# Patient Record
Sex: Male | Born: 1960 | Race: White | Hispanic: No | Marital: Married | Smoking: Never smoker
Health system: Southern US, Community
[De-identification: ages and names within clinical notes are randomized; demographics above are authoritative.]

## PROBLEM LIST (undated history)

## (undated) DIAGNOSIS — Z0000 Encounter for general adult medical examination without abnormal findings: Secondary | ICD-10-CM

## (undated) DIAGNOSIS — M545 Low back pain: Secondary | ICD-10-CM

## (undated) DIAGNOSIS — F418 Other specified anxiety disorders: Secondary | ICD-10-CM

## (undated) DIAGNOSIS — D126 Benign neoplasm of colon, unspecified: Secondary | ICD-10-CM

## (undated) DIAGNOSIS — G479 Sleep disorder, unspecified: Secondary | ICD-10-CM

## (undated) DIAGNOSIS — L719 Rosacea, unspecified: Secondary | ICD-10-CM

## (undated) DIAGNOSIS — R351 Nocturia: Secondary | ICD-10-CM

## (undated) DIAGNOSIS — G8929 Other chronic pain: Secondary | ICD-10-CM

## (undated) DIAGNOSIS — R51 Headache: Secondary | ICD-10-CM

## (undated) DIAGNOSIS — R011 Cardiac murmur, unspecified: Secondary | ICD-10-CM

## (undated) DIAGNOSIS — C801 Malignant (primary) neoplasm, unspecified: Secondary | ICD-10-CM

## (undated) DIAGNOSIS — C4490 Unspecified malignant neoplasm of skin, unspecified: Secondary | ICD-10-CM

## (undated) DIAGNOSIS — E785 Hyperlipidemia, unspecified: Secondary | ICD-10-CM

## (undated) DIAGNOSIS — M79672 Pain in left foot: Secondary | ICD-10-CM

## (undated) HISTORY — DX: Rosacea, unspecified: L71.9

## (undated) HISTORY — DX: Other chronic pain: G89.29

## (undated) HISTORY — DX: Headache: R51

## (undated) HISTORY — DX: Hyperlipidemia, unspecified: E78.5

## (undated) HISTORY — DX: Other specified anxiety disorders: F41.8

## (undated) HISTORY — DX: Pain in left foot: M79.672

## (undated) HISTORY — PX: CYSTOSCOPY WITH INSERTION OF UROLIFT: SHX6678

## (undated) HISTORY — DX: Low back pain: M54.5

## (undated) HISTORY — DX: Nocturia: R35.1

## (undated) HISTORY — DX: Sleep disorder, unspecified: G47.9

## (undated) HISTORY — DX: Encounter for general adult medical examination without abnormal findings: Z00.00

## (undated) HISTORY — DX: Unspecified malignant neoplasm of skin, unspecified: C44.90

## (undated) HISTORY — PX: COLONOSCOPY: SHX174

---
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
## 2009-04-22 ENCOUNTER — Ambulatory Visit: Payer: Self-pay | Admitting: Internal Medicine

## 2009-04-22 DIAGNOSIS — L719 Rosacea, unspecified: Secondary | ICD-10-CM | POA: Insufficient documentation

## 2009-04-22 DIAGNOSIS — R0609 Other forms of dyspnea: Secondary | ICD-10-CM | POA: Insufficient documentation

## 2009-04-22 DIAGNOSIS — R0989 Other specified symptoms and signs involving the circulatory and respiratory systems: Secondary | ICD-10-CM

## 2009-04-22 DIAGNOSIS — E785 Hyperlipidemia, unspecified: Secondary | ICD-10-CM | POA: Insufficient documentation

## 2009-04-22 LAB — CONVERTED CEMR LAB
ALT: 21 units/L (ref 0–53)
AST: 20 units/L (ref 0–37)
Albumin: 4.8 g/dL (ref 3.5–5.2)
Alkaline Phosphatase: 65 units/L (ref 39–117)
Basophils Absolute: 0 10*3/uL (ref 0.0–0.1)
CRP: 0.4 mg/dL (ref <0.6)
Calcium: 9.1 mg/dL (ref 8.4–10.5)
Chloride: 104 meq/L (ref 96–112)
Cholesterol: 249 mg/dL — ABNORMAL HIGH (ref 0–200)
Creatinine, Ser: 1.01 mg/dL (ref 0.40–1.50)
Eosinophils Absolute: 0.1 10*3/uL (ref 0.0–0.7)
HDL: 35 mg/dL — ABNORMAL LOW (ref >39)
LDL Cholesterol: 178 mg/dL — ABNORMAL HIGH (ref 0–99)
Lymphs Abs: 2 10*3/uL (ref 0.7–4.0)
MCV: 84.5 fL (ref 78.0–100.0)
Neutrophils Relative %: 61 % (ref 43–77)
Platelets: 225 10*3/uL (ref 150–400)
RDW: 13.5 % (ref 11.5–15.5)
TSH: 1.631 microintl units/mL (ref 0.350–4.500)
Total Protein: 7 g/dL (ref 6.0–8.3)
Triglycerides: 179 mg/dL — ABNORMAL HIGH (ref <150)
WBC: 7.1 10*3/uL (ref 4.0–10.5)

## 2009-04-26 ENCOUNTER — Telehealth: Payer: Self-pay | Admitting: Internal Medicine

## 2009-05-18 ENCOUNTER — Encounter: Payer: Self-pay | Admitting: Cardiology

## 2009-05-18 ENCOUNTER — Ambulatory Visit: Payer: Self-pay | Admitting: Cardiology

## 2009-08-18 ENCOUNTER — Ambulatory Visit: Payer: Self-pay | Admitting: Cardiovascular Disease

## 2009-08-18 ENCOUNTER — Ambulatory Visit: Payer: Self-pay

## 2009-08-18 ENCOUNTER — Encounter: Payer: Self-pay | Admitting: Cardiology

## 2009-08-18 ENCOUNTER — Ambulatory Visit (HOSPITAL_COMMUNITY): Admission: RE | Admit: 2009-08-18 | Discharge: 2009-08-18 | Payer: Self-pay | Admitting: Cardiology

## 2010-06-29 ENCOUNTER — Ambulatory Visit: Payer: Self-pay | Admitting: Internal Medicine

## 2010-06-29 LAB — CONVERTED CEMR LAB
CRP, High Sensitivity: 1.7
Cholesterol: 231 mg/dL — ABNORMAL HIGH (ref 0–200)
HDL: 36 mg/dL — ABNORMAL LOW (ref >39)
Triglycerides: 102 mg/dL (ref <150)

## 2010-06-30 ENCOUNTER — Telehealth: Payer: Self-pay | Admitting: Internal Medicine

## 2010-07-07 ENCOUNTER — Telehealth: Payer: Self-pay | Admitting: Internal Medicine

## 2010-07-11 ENCOUNTER — Ambulatory Visit: Payer: Self-pay | Admitting: Internal Medicine

## 2010-07-11 DIAGNOSIS — K625 Hemorrhage of anus and rectum: Secondary | ICD-10-CM | POA: Insufficient documentation

## 2010-07-12 ENCOUNTER — Encounter: Payer: Self-pay | Admitting: Internal Medicine

## 2010-11-21 NOTE — Assessment & Plan Note (Signed)
Summary: rectal bleeding/mhf--Rm 3   Vital Signs:  Patient profile:   50 year old male Height:      70 inches Weight:      201.50 pounds BMI:     29.02 O2 Sat:      97 % on Room air Temp:     98.0 degrees F oral Pulse rate:   84 / minute Pulse rhythm:   regular Resp:     16 per minute BP sitting:   116 / 82  (right arm) Cuff size:   regular  Vitals Entered By: Mervin Kung CMA Duncan Dull) (July 11, 2010 2:16 PM)  O2 Flow:  Room air CC: Rm 3  Pt states he has hemorrhoid since Saturday and bleeding since Monday. Is Patient Diabetic? No Comments Pt not taking Simvastatin yet, trying diet. Nicki Guadalajara Fergerson CMA Duncan Dull)  July 11, 2010 2:20 PM    Primary Care Provider:  D. Thomos Lemons DO  CC:  Rm 3  Pt states he has hemorrhoid since Saturday and bleeding since Monday.Marland Kitchen  History of Present Illness: 50 y/o white male c/o intermittent rectal bleeding Hx of bleeding hemorrhoids bleeding through his clothes no severe bleeding  some pain.  ? pain radiating to left leg   Preventive Screening-Counseling & Management  Alcohol-Tobacco     Alcohol drinks/day: 0     Alcohol type: all     Alcohol Counseling: not indicated; patient does not drink     Smoking Status: never     Tobacco Counseling: not indicated; no tobacco use  Allergies (verified): No Known Drug Allergies  Past History:  Past Medical History: Current Problems:  HYPERLIPIDEMIA (ICD-272.4) ROSACEA (ICD-695.3)  DYSPNEA ON EXERTION (ICD-786.09)  HEALTH MAINTENANCE EXAM (ICD-V70.0) FAMILY HISTORY OF CAD MALE 1ST DEGREE RELATIVE <50 (ICD-V17.3)  Past Surgical History: Tonsillectomy 1971     Family History: Family History High cholesterol Family History Hypertension Family History Kidney disease Colon ca - no Prostate ca - no Father with congestive heart failure    Social History: Occupation:  Nurse, 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   Lungs:  normal respiratory effort and normal breath sounds.   Heart:  normal rate, regular rhythm, no murmur, and no gallop.   Abdomen:  soft, non-tender, and normal bowel sounds.   Rectal:  thrombosed external hemorrhoid(s).  heme positive   Impression & Recommendations:  Problem # 1:  RECTAL BLEEDING (ICD-569.3)  pt has thrombosed ext hemorrhoids.  he will try conservative measures - sitz bath.  if symptoms get worse, we discussed referral to proctologist.  I doubt any other source of GI bleed but due to his age,  I rec we set up screening colonoscopy  Orders: Gastroenterology Referral (GI)  Complete Medication List: 1)  Fish Oil 1000 Mg Caps (Omega-3 fatty acids) .... Take 1 capsule by mouth three times a daysometimes 4 2)  Multivitamins Tabs (Multiple vitamin) .... Take 1 tablet by mouth once a day 3)  Lovastatin 20 Mg Tabs (Lovastatin) .... One by mouth once daily  Patient Instructions: 1)  Call our office if your symptoms do not  improve or gets worse. 2)  Stop aspirin and fish oil until rectal bleeding stops   Current Allergies (reviewed today): No known allergies

## 2010-11-21 NOTE — Assessment & Plan Note (Signed)
Summary: CPX/DT   Vital Signs:  Patient profile:   50 year old male Height:      70 inches Weight:      197.75 pounds BMI:     28.48 O2 Sat:      99 % on Room air Temp:     98.0 degrees F oral Pulse rate:   68 / minute Pulse rhythm:   regular Resp:     16 per minute BP sitting:   112 / 70  (right arm) Cuff size:   large  Vitals Entered By: Glendell Docker CMA (June 29, 2010 8:40 AM)  O2 Flow:  Room air CC: CPX Is Patient Diabetic? No Pain Assessment Patient in pain? no      Comments coffee this am no solid food   Primary Care Provider:  Dondra Spry DO  CC:  CPX.  History of Present Illness: 50 y/o white male for routine cpx last yr - underwent stress echo -normal he changed diet - less carbs.  lost approx 20 lbs feeling better  no dyspnea with exertion still active in [employer]  plays tennis  hyperlipidemia - never started simvastatin wants to see what cholesterol is since wt loss  Preventive Screening-Counseling & Management  Alcohol-Tobacco     Alcohol drinks/day: 0     Smoking Status: never  Caffeine-Diet-Exercise     Caffeine use/day: 2 beverages daily     Does Patient Exercise: yes     Times/week: 3  Allergies (verified): No Known Drug Allergies  Past History:  Past Medical History: Current Problems:  HYPERLIPIDEMIA (ICD-272.4) ROSACEA (ICD-695.3)  DYSPNEA ON EXERTION (ICD-786.09) HEALTH MAINTENANCE EXAM (ICD-V70.0) FAMILY HISTORY OF CAD MALE 1ST DEGREE RELATIVE <50 (ICD-V17.3)  Past Surgical History: Tonsillectomy 1971    Family History: Family History High cholesterol Family History Hypertension Family History Kidney disease Colon ca - no Prostate ca - no Father with congestive heart failure   Social History: Occupation:  Nurse, 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         no erectile dysfunction  Physical Exam  General:  alert, well-developed, and well-nourished.   Head:  normocephalic and atraumatic.   Eyes:  pupils equal, pupils round, and pupils reactive to light.   Ears:  R ear normal and L ear normal.   Mouth:  Oral mucosa and oropharynx without lesions or exudates.  Teeth in good repair. Neck:  No deformities, masses, or tenderness noted. Lungs:  normal respiratory effort and normal breath sounds.   Heart:  normal rate, regular rhythm, no murmur, and no gallop.   Abdomen:  soft, non-tender, normal bowel sounds, no hepatomegaly, and no splenomegaly.   Extremities:  No clubbing, cyanosis, edema  Neurologic:  cranial nerves II-XII intact and gait normal.   Skin:  2mm moles near left scapula and similar sized lesion mid lower back Psych:  normally interactive, good eye contact, and not anxious appearing.     Impression & Recommendations:  Problem # 1:  HEALTH MAINTENANCE EXAM (ICD-V70.0) Reviewed adult health maintenance protocols. Pt counseled on diet and exercise.  (increase intake of vegetable, reduce animal protein) next yr - colonoscopy wife to monitor small moles on back  Td Booster: Tdap (06/29/2010)   Chol: 249 (04/22/2009)   HDL: 35 (04/22/2009)   LDL: 178 (04/22/2009)  TG: 179 (04/22/2009) TSH: 1.631 (04/22/2009)     Td Booster: Tdap (06/29/2010)   Chol: 249 (04/22/2009)   HDL: 35 (04/22/2009)   LDL: 178 (04/22/2009)   TG: 179 (04/22/2009) TSH: 1.631 (04/22/2009)     Complete Medication List: 1)  Finacea 15 % Gel (Azelaic acid) .... Once a day 2)  Fish Oil 1000 Mg Caps (Omega-3 fatty acids) .... Take 1 capsule by mouth three times a daysometimes 4 3)  Multivitamins Tabs (Multiple vitamin) .... Take 1 tablet by mouth once a day  Other Orders: Tdap => 78yrs IM [phone]) Admin 1st Vaccine (60454) T-Lipid Profile [phone]) T- * Misc. Laboratory test 50 865)  Patient  Instructions: 1)  Please schedule a follow-up appointment in 1 year.  Current Allergies (reviewed today): No known allergies      Immunizations Administered:  Tetanus Vaccine:    Vaccine Type: Tdap    Site: left deltoid    Mfr: GlaxoSmithKline    Dose: 0.5 ml    Route: IM    Given by: Glendell Docker CMA    Exp. Date: 07/12/2012    Lot #: [id number]    VIS given: 09/08/08 version given June 29, 2010.

## 2010-11-21 NOTE — Progress Notes (Signed)
Summary: Lab results  Phone Note Outgoing Call   Summary of Call: call pt - bad cholesterol still too high 175.  I suggest pt start cholesterol lowering medication in addition to low saturated fat diet see rx for lovastatin.   plz schedule FLP, LFTs in 3 months. Initial call taken by: D. Thomos Lemons DO,  June 30, 2010 8:51 AM  Follow-up for Phone Call        Left message with pt's son to have pt return my call. Nicki Guadalajara Fergerson CMA Duncan Dull)  June 30, 2010 11:56 AM   Additional Follow-up for Phone Call Additional follow up Details #1::        call placed to patient at [phone], no answer. A detailed voice message was left informing patient per Dr Artist Pais instructions Additional Follow-up by: Glendell Docker CMA,  July 03, 2010 8:25 AM    New/Updated Medications: LOVASTATIN 20 MG TABS (LOVASTATIN) one by mouth once daily Prescriptions: LOVASTATIN 20 MG TABS (LOVASTATIN) one by mouth once daily  #30 x 3   Entered and Authorized by:   D. Thomos Lemons DO   Signed by:   D. Thomos Lemons DO on 06/30/2010   Method used:   Electronically to        [id number] (retail)       [address]       [place], [place]  [address]       Ph: [phone] or [phone]       Fax: [phone]   RxID:   [id number]

## 2010-11-21 NOTE — Miscellaneous (Signed)
Summary: lipid pnl order  Clinical Lists Changes  Orders: Added new Test order of T-Lipid Profile [phone]) - Signed

## 2010-11-21 NOTE — Progress Notes (Signed)
Summary: Lab result concerns--LM 9/16  Phone Note Call from Patient Call back at [phone]   Caller: Patient Reason for Call: Talk to Nurse Summary of Call: pt has questions about recent lab results Initial call taken by: Lannette Donath,  July 07, 2010 10:08 AM  Follow-up for Phone Call        Results given to pt again. Pt states he wants to continue losing weight to see if this will bring his cholesterol down. States that this has worked in the past. Does not want to start cholesterol med. Please advise. Nicki Guadalajara Fergerson CMA Duncan Dull)  July 07, 2010 1:04 PM   Additional Follow-up for Phone Call Additional follow up Details #1::        he can try additional 3 months of low sat fat diet and regular exercise.  arrange repeat FLP in  3months Additional Follow-up by: D. Thomos Lemons DO,  July 07, 2010 5:25 PM    Additional Follow-up for Phone Call Additional follow up Details #2::    Left detailed message on voicemail and to call me Monday to schedule lab appt. Nicki Guadalajara Fergerson CMA Duncan Dull)  July 07, 2010 5:44 PM

## 2011-05-12 ENCOUNTER — Emergency Department (HOSPITAL_BASED_OUTPATIENT_CLINIC_OR_DEPARTMENT_OTHER)
Admission: EM | Admit: 2011-05-12 | Discharge: 2011-05-12 | Disposition: A | Discharge status: Home / Self Care | Payer: Managed Care, Other (non HMO) | Attending: Emergency Medicine | Admitting: Emergency Medicine

## 2011-05-12 ENCOUNTER — Encounter: Payer: Self-pay | Admitting: Emergency Medicine

## 2011-05-12 DIAGNOSIS — L255 Unspecified contact dermatitis due to plants, except food: Secondary | ICD-10-CM | POA: Insufficient documentation

## 2011-05-12 DIAGNOSIS — L237 Allergic contact dermatitis due to plants, except food: Secondary | ICD-10-CM

## 2011-05-12 DIAGNOSIS — T622X1A Toxic effect of other ingested (parts of) plant(s), accidental (unintentional), initial encounter: Secondary | ICD-10-CM | POA: Insufficient documentation

## 2011-05-12 MED ORDER — DEXAMETHASONE SODIUM PHOSPHATE 10 MG/ML IJ SOLN
10.0000 mg | Freq: Once | INTRAMUSCULAR | Status: AC
  Administered 2011-05-12: 10 mg via INTRAVENOUS
  Filled 2011-05-12: qty 1

## 2011-05-12 MED ORDER — PREDNISONE 10 MG PO TABS: 20.0000 mg | ORAL_TABLET | Freq: Every day | ORAL | Status: DC

## 2011-05-12 MED ORDER — METHYLPREDNISOLONE ACETATE 40 MG/ML IJ SUSP
80.0000 mg | Freq: Once | INTRAMUSCULAR | Status: DC
  Filled 2011-05-12: qty 2

## 2011-05-12 NOTE — ED Notes (Signed)
Sterile drsg applied to site

## 2011-05-12 NOTE — ED Notes (Signed)
MD at bedside. 

## 2011-05-12 NOTE — ED Notes (Signed)
Pt reports exposure to Poison ivy to R arm 1 week ago with increased pain and discomfort

## 2011-05-12 NOTE — ED Notes (Signed)
Pt reports exposure to [employer] one week ago

## 2011-05-12 NOTE — ED Provider Notes (Signed)
History     Chief Complaint  Patient presents with  . Pruritis   HPI Comments: Pt got into poison ivy a week ago while clearing brush at a church function.  He has weeping rash on the inner aspect of both forearms, more on the right arm.   Patient is a 50 y.o. male presenting with Poison Ivy.  Poison Ivy The current episode started more than 1 week ago. The problem occurs constantly. The problem has been gradually worsening. The symptoms are aggravated by nothing. The symptoms are relieved by nothing.    History reviewed. No pertinent past medical history.  History reviewed. No pertinent past surgical history.  History reviewed. No pertinent family history.  History  Substance Use Topics  . Smoking status: Not on file  . Smokeless tobacco: Not on file  . Alcohol Use: 0.6 oz/week    1 Glasses of wine per week      Review of Systems  All other systems reviewed and are negative.    Physical Exam  BP 142/89  Pulse 76  Temp(Src) 98.3 F (36.8 C) (Oral)  Resp 18  SpO2 100%  Physical Exam  Nursing note and vitals reviewed. Constitutional: He is oriented to person, place, and time. He appears well-developed and well-nourished. No distress.  HENT:  Head: Normocephalic and atraumatic.  Eyes: Conjunctivae and EOM are normal. Pupils are equal, round, and reactive to light.  Neck: Normal range of motion. Neck supple.  Cardiovascular: Normal rate and regular rhythm.   Pulmonary/Chest: Effort normal and breath sounds normal.  Abdominal: Soft. Bowel sounds are normal.  Musculoskeletal: Normal range of motion.  Neurological: He is alert and oriented to person, place, and time.  Skin: Skin is warm and dry. Rash noted. Rash is macular and papular. Maculopapular rash: He has a weeping rash with ruptured tiny vesicles on the inner aspec of both forearms, worse on the right forearm.     Psychiatric: He has a normal mood and affect. His behavior is normal.    ED Course    Procedures  MDM  Rx with Decadron 10 mg IV now, and prednisone taper, 60, 60, 40, 40, 20, 20.    Carleene Cooper III, MD 05/12/11 [phone]

## 2011-06-04 ENCOUNTER — Encounter: Payer: Self-pay | Admitting: Cardiology

## 2011-06-22 ENCOUNTER — Encounter: Payer: Self-pay | Admitting: Internal Medicine

## 2011-06-26 ENCOUNTER — Encounter: Payer: Self-pay | Admitting: Internal Medicine

## 2011-06-26 ENCOUNTER — Ambulatory Visit (INDEPENDENT_AMBULATORY_CARE_PROVIDER_SITE_OTHER): Payer: Managed Care, Other (non HMO) | Admitting: Internal Medicine

## 2011-06-26 VITALS — BP 100/80 | HR 70 | Temp 97.6°F | Resp 18 | Ht 69.5 in | Wt 198.0 lb

## 2011-06-26 DIAGNOSIS — Z0000 Encounter for general adult medical examination without abnormal findings: Secondary | ICD-10-CM | POA: Insufficient documentation

## 2011-06-26 LAB — TSH: TSH: 1.317 u[IU]/mL (ref 0.350–4.500)

## 2011-06-26 LAB — LIPID PANEL
HDL: 44 mg/dL (ref >39)
LDL Cholesterol: 169 mg/dL — ABNORMAL HIGH (ref 0–99)

## 2011-06-26 LAB — CBC
Hemoglobin: 15.1 g/dL (ref 13.0–17.0)
Platelets: 197 10*3/uL (ref 150–400)
RBC: 5.13 MIL/uL (ref 4.22–5.81)
WBC: 5.5 10*3/uL (ref 4.0–10.5)

## 2011-06-26 LAB — HEPATIC FUNCTION PANEL
AST: 19 U/L (ref 0–37)
Albumin: 4.8 g/dL (ref 3.5–5.2)
Alkaline Phosphatase: 54 U/L (ref 39–117)
Indirect Bilirubin: 0.8 mg/dL (ref 0.0–0.9)
Total Protein: 6.7 g/dL (ref 6.0–8.3)

## 2011-06-26 LAB — BASIC METABOLIC PANEL
CO2: 29 mEq/L (ref 19–32)
Chloride: 104 mEq/L (ref 96–112)
Glucose, Bld: 86 mg/dL (ref 70–99)
Potassium: 4.7 mEq/L (ref 3.5–5.3)
Sodium: 143 mEq/L (ref 135–145)

## 2011-06-26 NOTE — Progress Notes (Signed)
  Subjective:    Patient ID: Tony Paul, male    DOB: Mar 05, 1961, 50 y.o.   MRN: [id number]  HPI Pt presents to clinic for annual physical. H/o hyperlipidemia and has not required medication. Has not undergone colonoscopy. No active complaints.    Past Medical History  Diagnosis Date  . HLD (hyperlipidemia)   . Rosacea   . Dyspnea     on exertion  . Health maintenance examination    Past Surgical History  Procedure Date  . Tonsillectomy 1971    reports that he has never smoked. He has never used smokeless tobacco. He reports that he drinks about .6 ounces of alcohol per week. He reports that he does not use illicit drugs. family history includes Heart failure in his father; Hyperlipidemia in his other; Hypertension in his other; and Kidney disease in his other.  There is no history of Colon cancer and Prostate cancer. No Known Allergies   Review of Systems  Constitutional: Negative for unexpected weight change.  Respiratory: Negative for shortness of breath.   Cardiovascular: Negative for chest pain.  Gastrointestinal: Negative for abdominal pain.  All other systems reviewed and are negative.       Objective:   Physical Exam  Nursing note and vitals reviewed. Constitutional: He appears well-developed and well-nourished. No distress.  HENT:  Head: Normocephalic and atraumatic.  Right Ear: Tympanic membrane, external ear and ear canal normal.  Left Ear: Tympanic membrane, external ear and ear canal normal.  Nose: Nose normal.  Mouth/Throat: Oropharynx is clear and moist. No oropharyngeal exudate.  Eyes: Conjunctivae and EOM are normal. Pupils are equal, round, and reactive to light. Right eye exhibits no discharge. Left eye exhibits no discharge. No scleral icterus.  Neck: Neck supple. Carotid bruit is not present. No thyromegaly present.  Cardiovascular: Normal rate and regular rhythm.  Exam reveals no gallop and no friction rub.   No murmur heard. Pulmonary/Chest:  Effort normal and breath sounds normal. No respiratory distress. He has no wheezes. He has no rales.  Abdominal: Soft. Normal appearance and bowel sounds are normal. He exhibits no distension and no mass. There is no hepatosplenomegaly. There is no tenderness. There is no rebound and no guarding.  Lymphadenopathy:    He has no cervical adenopathy.  Neurological: He is alert.  Skin: Skin is warm and dry. No rash noted. He is not diaphoretic.  Psychiatric: He has a normal mood and affect.          Assessment & Plan:

## 2011-06-26 NOTE — Assessment & Plan Note (Signed)
Nl exam. Obtain cpe labs. EKG obtained shows nsr 63 with nl intervals and axis. Recommend screening colonoscopy. Understands colorectal cancer screening recommendations and the procedure. States will check with his insurance company first before scheduling. Discussed and recommended low fat diet and regular aerobic exercise.

## 2011-06-27 LAB — URINALYSIS
Hgb urine dipstick: NEGATIVE
Ketones, ur: NEGATIVE mg/dL
Leukocytes, UA: NEGATIVE
Nitrite: NEGATIVE
Specific Gravity, Urine: 1.027 (ref 1.005–1.030)
Urobilinogen, UA: 0.2 mg/dL (ref 0.0–1.0)
pH: 6 (ref 5.0–8.0)

## 2011-07-03 ENCOUNTER — Other Ambulatory Visit: Payer: Self-pay | Admitting: *Deleted

## 2011-07-03 DIAGNOSIS — E785 Hyperlipidemia, unspecified: Secondary | ICD-10-CM

## 2011-07-03 MED ORDER — EZETIMIBE 10 MG PO TABS: 10.0000 mg | ORAL_TABLET | Freq: Every day | ORAL | Status: DC

## 2011-10-23 HISTORY — PX: COLON SURGERY: SHX602

## 2012-06-24 ENCOUNTER — Encounter: Payer: Managed Care, Other (non HMO) | Admitting: Internal Medicine

## 2012-07-17 ENCOUNTER — Ambulatory Visit (INDEPENDENT_AMBULATORY_CARE_PROVIDER_SITE_OTHER): Payer: 59 | Admitting: Internal Medicine

## 2012-07-17 ENCOUNTER — Encounter: Payer: Self-pay | Admitting: Internal Medicine

## 2012-07-17 VITALS — BP 100/78 | HR 79 | Temp 97.9°F | Resp 16 | Ht 70.0 in | Wt 203.2 lb

## 2012-07-17 DIAGNOSIS — Z23 Encounter for immunization: Secondary | ICD-10-CM

## 2012-07-17 DIAGNOSIS — Z0000 Encounter for general adult medical examination without abnormal findings: Secondary | ICD-10-CM

## 2012-07-17 DIAGNOSIS — Z1211 Encounter for screening for malignant neoplasm of colon: Secondary | ICD-10-CM

## 2012-07-17 LAB — CBC WITH DIFFERENTIAL/PLATELET
Basophils Absolute: 0 10*3/uL (ref 0.0–0.1)
Basophils Relative: 1 % (ref 0–1)
Eosinophils Absolute: 0.1 10*3/uL (ref 0.0–0.7)
MCH: 30 pg (ref 26.0–34.0)
MCHC: 35.2 g/dL (ref 30.0–36.0)
Neutrophils Relative %: 60 % (ref 43–77)
Platelets: 222 10*3/uL (ref 150–400)
RBC: 5.03 MIL/uL (ref 4.22–5.81)

## 2012-07-17 LAB — BASIC METABOLIC PANEL
Calcium: 9.7 mg/dL (ref 8.4–10.5)
Sodium: 140 mEq/L (ref 135–145)

## 2012-07-17 LAB — TSH: TSH: 1.619 u[IU]/mL (ref 0.350–4.500)

## 2012-07-17 LAB — HEPATIC FUNCTION PANEL
ALT: 13 U/L (ref 0–53)
AST: 16 U/L (ref 0–37)
Albumin: 4.8 g/dL (ref 3.5–5.2)

## 2012-07-17 LAB — LIPID PANEL
Cholesterol: 247 mg/dL — ABNORMAL HIGH (ref 0–200)
HDL: 39 mg/dL — ABNORMAL LOW (ref >39)
Total CHOL/HDL Ratio: 6.3 Ratio

## 2012-07-18 DIAGNOSIS — Z23 Encounter for immunization: Secondary | ICD-10-CM

## 2012-07-18 LAB — URINALYSIS, ROUTINE W REFLEX MICROSCOPIC
Bilirubin Urine: NEGATIVE
Glucose, UA: NEGATIVE mg/dL
Ketones, ur: NEGATIVE mg/dL
Leukocytes, UA: NEGATIVE
Nitrite: NEGATIVE
Protein, ur: NEGATIVE mg/dL
Specific Gravity, Urine: 1.021 (ref 1.005–1.030)
Urobilinogen, UA: 0.2 mg/dL (ref 0.0–1.0)
pH: 6 (ref 5.0–8.0)

## 2012-07-18 LAB — URINALYSIS, MICROSCOPIC ONLY
Casts: NONE SEEN
Crystals: NONE SEEN

## 2012-07-20 NOTE — Assessment & Plan Note (Signed)
Normal exam. Administer influenza vaccine. Obtain CPE labs including PSA-discussed pros and cons. Proceed with colonoscopy referral

## 2012-07-20 NOTE — Progress Notes (Signed)
  Subjective:    Patient ID: Tony Paul, male    DOB: 12-17-1960, 51 y.o.   MRN: [id number]  HPI patient presents to clinic for annual exam. States is ready proceed with colonoscopy. No active complaints.  Past Medical History  Diagnosis Date  . HLD (hyperlipidemia)   . Rosacea   . Dyspnea     on exertion  . Health maintenance examination    Past Surgical History  Procedure Date  . Tonsillectomy 1971    reports that he has never smoked. He has never used smokeless tobacco. He reports that he drinks about .6 ounces of alcohol per week. He reports that he does not use illicit drugs. family history includes Heart failure in his father; Hyperlipidemia in his other; Hypertension in his other; and Kidney disease in his other.  There is no history of Colon cancer and Prostate cancer. No Known Allergies   Review of Systems see history of present illness     Objective:   Physical Exam  Physical Exam  Nursing note and vitals reviewed. Constitutional: He appears well-developed and well-nourished. No distress.  HENT:  Head: Normocephalic and atraumatic.  Right Ear: Tympanic membrane and external ear normal.  Left Ear: Tympanic membrane and external ear normal.  Nose: Nose normal.  Mouth/Throat: Uvula is midline, oropharynx is clear and moist and mucous membranes are normal. No oropharyngeal exudate.  Eyes: Conjunctivae and EOM are normal. Pupils are equal, round, and reactive to light. Right eye exhibits no discharge. Left eye exhibits no discharge. No scleral icterus.  Neck: Neck supple. Carotid bruit is not present. No thyromegaly present.  Cardiovascular: Normal rate, regular rhythm and normal heart sounds.  Exam reveals no gallop and no friction rub.   No murmur heard. Pulmonary/Chest: Effort normal and breath sounds normal. No respiratory distress. He has no wheezes. He has no rales.  Abdominal: Soft. He exhibits no distension and no mass. There is no hepatosplenomegaly. There  is no tenderness. There is no rebound. Hernia confirmed negative in the right inguinal area and confirmed negative in the left inguinal area.  Lymphadenopathy:    He has no cervical adenopathy.  Neurological: He is alert.  Skin: Skin is warm and dry. He is not diaphoretic.  Psychiatric: He has a normal mood and affect.        Assessment & Plan:

## 2012-07-24 ENCOUNTER — Telehealth: Payer: Self-pay | Admitting: *Deleted

## 2012-07-24 DIAGNOSIS — E785 Hyperlipidemia, unspecified: Secondary | ICD-10-CM

## 2012-07-24 MED ORDER — ATORVASTATIN CALCIUM 20 MG PO TABS: 20.0000 mg | ORAL_TABLET | Freq: Every day | ORAL | Status: DC

## 2012-07-24 NOTE — Telephone Encounter (Signed)
Pt returned call and was notified of results / instructions. Pt requests rx be sent to [facility] on file and he will return to the lab around 6 weeks. Lab order entered. Copy given to the lab and mailed to pt.

## 2012-07-24 NOTE — Telephone Encounter (Signed)
Message copied by Regis Bill on Thu Jul 24, 2012 12:08 PM ------      Message from: Edwyna Perfect      Created: Wed Jul 23, 2012  7:58 PM       Chol numbers even higher. See if willing to try statin. lipitor 20mg  qd rf#6 with lipid/lft 272.4 4-6 wks after beginning

## 2012-07-24 NOTE — Telephone Encounter (Signed)
LMOM with contact name & number for return call RE: results & further instructions/recommendation from MD/SLS

## 2012-08-07 ENCOUNTER — Ambulatory Visit (AMBULATORY_SURGERY_CENTER): Payer: PRIVATE HEALTH INSURANCE | Admitting: *Deleted

## 2012-08-07 VITALS — Ht 70.0 in | Wt 203.1 lb

## 2012-08-07 DIAGNOSIS — Z1211 Encounter for screening for malignant neoplasm of colon: Secondary | ICD-10-CM

## 2012-08-07 MED ORDER — MOVIPREP 100 G PO SOLR: 1.0000 | Freq: Once | ORAL | Status: DC

## 2012-08-07 MED ORDER — NA SULFATE-K SULFATE-MG SULF 17.5-3.13-1.6 GM/177ML PO SOLN: 1.0000 | Freq: Once | ORAL | Status: DC

## 2012-08-21 ENCOUNTER — Encounter: Payer: 59 | Admitting: Internal Medicine

## 2012-08-25 ENCOUNTER — Encounter: Payer: 59 | Admitting: Internal Medicine

## 2012-09-01 ENCOUNTER — Ambulatory Visit (AMBULATORY_SURGERY_CENTER): Payer: PRIVATE HEALTH INSURANCE | Admitting: Internal Medicine

## 2012-09-01 ENCOUNTER — Encounter: Payer: Self-pay | Admitting: Internal Medicine

## 2012-09-01 VITALS — BP 146/84 | HR 73 | Temp 98.1°F | Resp 11 | Ht 70.0 in | Wt 203.0 lb

## 2012-09-01 DIAGNOSIS — Z1211 Encounter for screening for malignant neoplasm of colon: Secondary | ICD-10-CM

## 2012-09-01 DIAGNOSIS — D126 Benign neoplasm of colon, unspecified: Secondary | ICD-10-CM

## 2012-09-01 MED ORDER — SODIUM CHLORIDE 0.9 % IV SOLN: 500.0000 mL | INTRAVENOUS | Status: DC

## 2012-09-01 NOTE — Progress Notes (Signed)
Patient did not have preoperative order for IV antibiotic SSI prophylaxis. (G8918)  Patient did not experience any of the following events: a burn prior to discharge; a fall within the facility; wrong site/side/patient/procedure/implant event; or a hospital transfer or hospital admission upon discharge from the facility. (G8907)  

## 2012-09-01 NOTE — Patient Instructions (Addendum)
YOU HAD AN ENDOSCOPIC PROCEDURE TODAY AT THE [facility]: Refer to the procedure report that was given to you for any specific questions about what was found during the examination.  If the procedure report does not answer your questions, please call your gastroenterologist to clarify.  If you requested that your care partner not be given the details of your procedure findings, then the procedure report has been included in a sealed envelope for you to review at your convenience later.  YOU SHOULD EXPECT: Some feelings of bloating in the abdomen. Passage of more gas than usual.  Walking can help get rid of the air that was put into your GI tract during the procedure and reduce the bloating. If you had a lower endoscopy (such as a colonoscopy or flexible sigmoidoscopy) you may notice spotting of blood in your stool or on the toilet paper. If you underwent a bowel prep for your procedure, then you may not have a normal bowel movement for a few days.  DIET: Your first meal following the procedure should be a light meal and then it is ok to progress to your normal diet.  A half-sandwich or bowl of soup is an example of a good first meal.  Heavy or fried foods are harder to digest and may make you feel nauseous or bloated.  Likewise meals heavy in dairy and vegetables can cause extra gas to form and this can also increase the bloating.  Drink plenty of fluids but you should avoid alcoholic beverages for 24 hours.  ACTIVITY: Your care partner should take you home directly after the procedure.  You should plan to take it easy, moving slowly for the rest of the day.  You can resume normal activity the day after the procedure however you should NOT DRIVE or use heavy machinery for 24 hours (because of the sedation medicines used during the test).    SYMPTOMS TO REPORT IMMEDIATELY: A gastroenterologist can be reached at any hour.  During normal business hours, 8:30 AM to 5:00 PM Monday through Friday,  call [phone].  After hours and on weekends, please call the GI answering service at [phone] who will take a message and have the physician on call contact you.   Following lower endoscopy (colonoscopy or flexible sigmoidoscopy):  Excessive amounts of blood in the stool  Significant tenderness or worsening of abdominal pains  Swelling of the abdomen that is new, acute  Fever of 100F or higher  FOLLOW UP: If any biopsies were taken you will be contacted by phone or by letter within the next 1-3 weeks.  Call your gastroenterologist if you have not heard about the biopsies in 3 weeks.  Our staff will call the home number listed on your records the next business day following your procedure to check on you and address any questions or concerns that you may have at that time regarding the information given to you following your procedure. This is a courtesy call and so if there is no answer at the home number and we have not heard from you through the emergency physician on call, we will assume that you have returned to your regular daily activities without incident.  SIGNATURES/CONFIDENTIALITY: You and/or your care partner have signed paperwork which will be entered into your electronic medical record.  These signatures attest to the fact that that the information above on your After Visit Summary has been reviewed and is understood.  Full responsibility of the confidentiality of this   discharge information lies with you and/or your care-partner.   The doctor will call you when the biopsies come back.   At that time you both can decide which type of removal of the area you would prefer.  It is too big to take out through the colonoscopy.  If it is positive, the doctor will order a CT scan.   We are here if you need [place], please call [phone].        Thank-you for choosing [place] for your healthcare needs.

## 2012-09-01 NOTE — Op Note (Signed)
[facility] [address] [place], [address]   COLONOSCOPY PROCEDURE REPORT  PATIENT: [name], [name]  MR#: [id number] BIRTHDATE: 10/19/1961 , 51  yrs. old GENDER: Male ENDOSCOPIST: Beverley Fiedler, MD REFERRED BY: PROCEDURE DATE:  09/01/2012 PROCEDURE:   Colonoscopy with biopsy and Colonoscopy with cold biopsy polypectomy ASA CLASS:   Class II INDICATIONS:average risk screening and first colonoscopy. MEDICATIONS: MAC sedation, administered by CRNA and propofol (Diprivan) 300mg  IV  DESCRIPTION OF PROCEDURE:   After the risks benefits and alternatives of the procedure were thoroughly explained, informed consent was obtained.  A digital rectal exam revealed no abnormalities of the rectum.   The LB [id number]  endoscope was introduced through the anus and advanced to the terminal ileum which was intubated for a short distance. No adverse events experienced.   The quality of the prep was Suprep fair  The instrument was then slowly withdrawn as the colon was fully examined.      COLON FINDINGS: The mucosa appeared normal in the terminal ileum. Four sessile polyps ranging between 3-42mm in size were found in the descending colon, sigmoid colon, and rectum.  Polypectomy was performed with cold forceps.  All resections were complete and all polyp tissue was completely retrieved.   A one-quarter circumferential firm mass, measuring 3 X 2cm in size, was found in the sigmoid colon.  Multiple biopsies of the lesion were performed using cold forceps.  A tattoo was placed at the proximal and distal margins.  Retroflexed views revealed no abnormalities. The time to cecum=4 minutes 15 seconds.  Withdrawal time=20 minutes 45 seconds. The scope was withdrawn and the procedure completed.  COMPLICATIONS: There were no complications.   ENDOSCOPIC IMPRESSION: 1.   Normal mucosa in the terminal ileum 2.   Four sessile polyps ranging between 3-54mm in size were found  in the descending colon, sigmoid colon, and rectum; Polypectomy was performed with cold forceps 3.   One-quarter circumferential mass, measuring 3 X 2cm in size, were found in the sigmoid colon; multiple biopsies of the lesion were performed; a tattoo was applied  RECOMMENDATIONS: 1.  Await pathology results 2.  Further recommendations after pathology results are available   eSigned:  Beverley Fiedler, MD 09/01/2012 9:40 AM   cc: Charlynn Court MD and The Patient   PATIENT NAME:  Tony Paul, Tony Paul MR#: [id number]

## 2012-09-02 ENCOUNTER — Telehealth: Payer: Self-pay

## 2012-09-02 ENCOUNTER — Telehealth: Payer: Self-pay | Admitting: *Deleted

## 2012-09-02 DIAGNOSIS — D126 Benign neoplasm of colon, unspecified: Secondary | ICD-10-CM

## 2012-09-02 DIAGNOSIS — K635 Polyp of colon: Secondary | ICD-10-CM

## 2012-09-02 NOTE — Telephone Encounter (Signed)
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 Patient is scheduled to see Dr. Romie Levee on 09/09/12, arrive @ 9:10am. If you have any questions please call [phone].  Informed pt of appt and location; pt stated understanding.

## 2012-09-02 NOTE — Telephone Encounter (Signed)
Left message

## 2012-09-03 ENCOUNTER — Encounter: Payer: Self-pay | Admitting: Internal Medicine

## 2012-09-04 ENCOUNTER — Encounter: Payer: Self-pay | Admitting: Internal Medicine

## 2012-09-09 ENCOUNTER — Encounter (INDEPENDENT_AMBULATORY_CARE_PROVIDER_SITE_OTHER): Payer: Self-pay | Admitting: General Surgery

## 2012-09-09 ENCOUNTER — Telehealth (INDEPENDENT_AMBULATORY_CARE_PROVIDER_SITE_OTHER): Payer: Self-pay

## 2012-09-09 ENCOUNTER — Telehealth: Payer: Self-pay | Admitting: Internal Medicine

## 2012-09-09 ENCOUNTER — Ambulatory Visit (INDEPENDENT_AMBULATORY_CARE_PROVIDER_SITE_OTHER): Payer: BC Managed Care – PPO | Admitting: General Surgery

## 2012-09-09 VITALS — BP 116/84 | HR 72 | Temp 97.9°F | Resp 18 | Ht 70.0 in | Wt 206.0 lb

## 2012-09-09 DIAGNOSIS — D126 Benign neoplasm of colon, unspecified: Secondary | ICD-10-CM

## 2012-09-09 MED ORDER — METRONIDAZOLE 500 MG PO TABS: 500.0000 mg | ORAL_TABLET | ORAL | Status: DC

## 2012-09-09 MED ORDER — METRONIDAZOLE 500 MG PO TABS: 500.0000 mg | ORAL_TABLET | ORAL | Status: AC

## 2012-09-09 NOTE — Progress Notes (Signed)
Chief Complaint  Patient presents with  . Colon Polyps    eval colon polyp    HISTORY: Tony Paul is a 51 y.o. male who presents to the office with a mass found on colonoscopy.  This was found on colonoscopy. He denies any changes in his bowel habits or abd pain.  He denies any bloody bowel movements.  He denies any weight loss or changes in his appetite.    Past Medical History  Diagnosis Date  . HLD (hyperlipidemia)   . Rosacea   . Dyspnea     on exertion  . Health maintenance examination       Past Surgical History  Procedure Date  . Tonsillectomy 1971  . Colonoscopy       Current Outpatient Prescriptions  Medication Sig Dispense Refill  . Multiple Vitamins-Iron (MULTIVITAMIN/IRON) TABS Take by mouth daily.        . Omega-3 Fatty Acids (FISH OIL) 1000 MG CAPS Take by mouth 3 (three) times daily. (sometimes 4)       . atorvastatin (LIPITOR) 20 MG tablet Take 1 tablet (20 mg total) by mouth daily.  30 tablet  6  . ezetimibe (ZETIA) 10 MG tablet Take 1 tablet (10 mg total) by mouth daily.  30 tablet  3      No Known Allergies    Family History  Problem Relation Age of Onset  . Heart failure Father   . Hypertension Other   . Hyperlipidemia Other   . Kidney disease Other   . Colon cancer Neg Hx   . Prostate cancer Neg Hx       History   Social History  . Marital Status: Married    Spouse Name: N/A    Number of Children: N/A  . Years of Education: N/A   Social History Main Topics  . Smoking status: Never Smoker   . Smokeless tobacco: Never Used  . Alcohol Use: 0.6 oz/week    1 Glasses of wine per week     Comment: some weeks none  . Drug Use: No  . Sexually Active: Not on file   Other Topics Concern  . 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  Well nourished and well groomed.   Neurological: Alert and oriented to person, place, and time. Coordination normal.  Head: Normocephalic and atraumatic.  Eyes: Conjunctivae are normal. Pupils are equal, round, and reactive to light. No scleral icterus.  Neck: Normal range of motion. Neck supple. No tracheal deviation or thyromegaly present.  No cervical lymphadenopathy. Cardiovascular: Normal rate, regular rhythm, normal heart sounds and intact distal pulses.  Exam reveals no gallop and no friction rub.  No murmur heard. Respiratory: Effort normal.  No respiratory distress. No chest wall tenderness. Breath sounds normal.  No wheezes, rales or   rhonchi.  GI: Soft. Bowel sounds are normal. The abdomen is soft and nontender.  There is no rebound and no guarding.  Musculoskeletal: Normal range of motion. Extremities are nontender.  Skin: Skin is warm and dry. No rash noted. No diaphoresis. No erythema. No pallor. No clubbing, cyanosis, or edema.   Psychiatric: Normal mood and affect. Behavior is normal. Judgment and thought content normal.     LABORATORY RESULTS: Available labs are reviewed CEA: pend   ASSESSMENT AND PLAN: Tony Paul is a 51 y.o. M who was referred to me from Dr Pyrtle after a large sessile sigmoid mass was found on colonoscopy.  This was felt to be unresectable endoscopically.  He is here to discuss surgical resection.  I discussed with them Dr Pyrtle's concern that this is a colon cancer underneath of the are that  he biopsied.  I discussed the surgical procedure in detail and answered all questions.  I will schedule him for a laparoscopic sigmoidectomy.  He has instructions for his bowel prep.  We will try to get this completed as soon as possible.  The surgery and anatomy were described to the patient as well as the risks of surgery and the possible complications.  These include: Bleeding, infection and possible wound complications such as hernia, damage to adjacent structures, leak of surgical connections, which can lead to other surgeries and possibly an ostomy (5-7%), possible need for other procedures, such as abscess drains in radiology, possible prolonged hospital stay, possible diarrhea from removal of part of the colon, possible constipation from narcotics, prolonged fatigue/weakness or appetite loss, possible early recurrence of cancer, possible complications of their medical problems such as heart disease or arrhythmias or lung problems, death (less than 1%). I believe the patient understands and wishes to proceed with the surgery.     Tony Burgoon C Zabria Liss, MD Colon and Rectal Surgery / General Surgery [facility]      Visit Diagnoses: No diagnosis found.  Primary Care Physician: Hodgin Jr, Algie Cales Whitson, MD   

## 2012-09-09 NOTE — Telephone Encounter (Signed)
Wife is very upset that pt was given a surgical date of 10/24/12; they thought it was urgent to get the mass out. Wife has since called [facility] back and was told Dr Maisie Fus will review her schedule and maybe move up the date. Dr Rhea Belton, wife states you wanted the mass out by mid December; OK to wait? Thanks.

## 2012-09-09 NOTE — Patient Instructions (Signed)
Colorectal Cancer  Colorectal cancer is the second most common cancer in the [place], striking 140,000 people annually and causing 60,000 deaths. That's a staggering figure when you consider the disease is potentially curable if diagnosed in the early stages. Who is at risk? Though colorectal cancer may occur at any age, more than 90% of the patients are over age 51, at which point the risk doubles every ten years. In addition to age, other high risk factors include a family history of colorectal cancer and polyps and a personal history of ulcerative colitis, colon polyps or cancer of other organs, especially of the breast or uterus. How does it start? It is generally agreed that nearly all colon and rectal cancer begins in benign polyps. These pre-malignant growths occur on the bowel wall and may eventually increase in size and become cancer. Removal of benign polyps is one aspect of preventive medicine that really works! What are the symptoms? The most common symptoms are rectal bleeding and changes in bowel habits, such as constipation or diarrhea. (These symptoms are also common in other diseases so it is important you receive a thorough examination should you experience them.) Abdominal pain and weight loss are usually late symptoms indicating possible extensive disease. Unfortunately, many polyps and early cancers fail to produce symptoms. Therefore, it is important that your routine physical includes colorectal cancer detection procedures once you reach age 50.  There are several methods for detection of colorectal cancer. These include digital rectal examination, a chemical test of the stool for blood, flexible sigmoidoscopy and colonoscopy (lighted tubular instruments used to inspect the lower bowel) and barium enema. Be sure to discuss these options with your surgeon to determine which procedure is best for you. Individuals who have a first-degree relative (parent or sibling) with colon  cancer or polyps should start their colon cancer screening at the age of 51. How is colorectal cancer treated? Colorectal cancer requires surgery in nearly all cases for complete cure. Radiation and chemotherapy are sometimes used in addition to surgery. Between 80-90% are restored to normal health if the cancer is detected and treated in the earliest stages. The cure rate drops to 50% or less when diagnosed in the later stages. Thanks to modern technology, less than 5% of all colorectal cancer patients require a colostomy, the surgical construction of an artificial excretory opening from the colon. Can colon cancer be prevented? Colon cancer is very preventable. The most important step towards preventing colon cancer is getting a screening test. Any abnormal screening test should be followed by a colonoscopy. Some individuals prefer to start with colonoscopy as a screening test. Colonoscopy provides a detailed examination of the bowel. Polyps can be identified and can often be removed during colonoscopy. Though not definitely proven, there is some evidence that diet may play a significant role in preventing colorectal cancer. As far as we know, a high fiber, low fat diet is the only dietary measure that might help prevent colorectal cancer. Finally, pay attention to changes in your bowel habits. Any new changes such as persistent constipation, diarrhea, or blood in the stool should be discussed with your physician.   Can hemorrhoids lead to colon cancer? No, but hemorrhoids may produce symptoms similar to colon polyps or cancer. Should you experience these symptoms, you should have them examined and evaluated by a physician, preferably by a colon and rectal surgeon. What is a colon and rectal surgeon? Colon and rectal surgeons are experts in the surgical and non-surgical treatment   of diseases of the colon, rectum and anus. They have completed advanced surgical training in the treatment of these  diseases as well as full general surgical training. Board-certified colon and rectal surgeons complete residencies in general surgery and colon and rectal surgery, and pass intensive examinations conducted by the American Board of Surgery and the American Board of Colon and Rectal Surgery. They are well-versed in the treatment of both benign and malignant diseases of the colon, rectum and anus and are able to perform routine screening examinations and surgically treat conditions if indicated to do so.  2012 American Society of Colon & Rectal Surgeons     [facility]  ONE-DAY (1) PRE-OP HOME COLON PREP INSTRUCTIONS: ** MIRALAX / GATORADE PREP / FLAGYL**  You must follow the instructions below carefully.  If you have questions or problems, please call and speak to someone in the clinic department at our office:   [phone].     INSTRUCTIONS: 1. Five days prior to your procedure do not eat nuts, popcorn, or fruit with seeds.  Stop all fiber supplements such as Metamucil, Citrucel, etc. 2. Two days before surgery fill the prescription at a pharmacy of your choice and purchase the additional supplies below.         MIRALAX - GATORADE -- DULCOLAX TABS -- FLEET ENEMA:   Purchase a bottle of MIRALAX  (255 gm bottle)    In addition, purchase four (4) DULCOLAX TABLETS (no prescription required- ask the pharmacist if you can't find them)   Purchase one Fleet Enema (Green and white box)    Purchase one 64 oz GATORADE.  (Do NOT purchase red Gatorade; any other flavor is acceptable) and place in refrigerator to get cold.  3.   Day Before Surgery:   6 am: Wash you abdomen with soap and repeat this on the morning of surgery and take 4 Dulcolax tablets   You may only have clear liquids (tea, coffee, juice, broth, jello, soft drinks, gummy bears).  You cannot have solid foods, cream, milk or milk products.  Drink at lease 8 ounces of liquids every hour while awake.   Take the Flagyl prescription  as directed at 8 am, 2 pm and 8 pm.  It is helpful to take this with some jello instead of on an empty stomach.  Any flavor is ok, except red jello, which will cause red stools.   Mix the entire bottle of MiraLax and the Gatorade in a large container.    10:00am: Begin drinking the Gatorade mixture until gone (8 oz every 15-30 minutes).      You may suck on a lime wedge or hard candy to "freshen your palate" in between glasses   If you are a diabetic, take your blood sugar reading several time throughout the prep.  Have some juice available to take if your sugar level gets too low   You may feel chilled while taking the prep.  Have some warm tea or broth to help warm up.   Continue clear liquids until midnight or bedtime  3. The day of your procedure:   Do not eat or drink ANYTHING after midnight before your surgery.     If you take Heart or Blood Pressure medicine, ask the pre-op nurses about these during your preop appointment.   Take a regular Fleet Enema one hour before leaving the come to the hospital.  Try to hold it in 5-10 mins before expelling.   Further pre-operative instructions will be   given to you from the hospital.   Expect to be contacted 5-7 days before your surgery.     

## 2012-09-09 NOTE — Telephone Encounter (Signed)
I called the pt's wife back.  They are both concerned having to wait for 6 weeks for surgery.  They were under the impression that it was more of an urgent matter to get taken care of within a couple weeks.  I told her I thought the scheduler probably posted it on her 1st available that she saw.  I will discuss with Dr Maisie Fus and see if she can look at the schedule to try to move it up.  I did not think she wanted to wait that long either.  I will call them back tomorrow.

## 2012-09-09 NOTE — Telephone Encounter (Signed)
Informed pt's wife of Dr Lauro Franklin comments and recommendations as listed in previous note; wife stated understanding.

## 2012-09-09 NOTE — Telephone Encounter (Signed)
Message copied by Ivory Broad on Tue Sep 09, 2012  2:03 PM ------      Message from: Cathi Roan      Created: Tue Sep 09, 2012  1:45 PM      Regarding: Husband      Contact: [phone]       Patient's wife would like to talk with you about husbands surgery date.

## 2012-09-09 NOTE — Telephone Encounter (Signed)
They should take this issue up with Dr. Maisie Fus Waiting 2-3 additional weeks will likely not make a measurable difference in her overall outcome. I understand why they are anxious to get this lesion removed, but again Dr. Maisie Fus is the only person that can control her schedule.

## 2012-09-10 NOTE — Telephone Encounter (Signed)
I found out from Dr Maisie Fus the surgery was rescheduled to 12/19.  I called the wife anyway and left a message to make sure they were ok with the new date or if I can help with anything.

## 2012-10-01 ENCOUNTER — Encounter (HOSPITAL_COMMUNITY): Payer: Self-pay | Admitting: Pharmacy Technician

## 2012-10-03 ENCOUNTER — Encounter (HOSPITAL_COMMUNITY)
Admission: RE | Admit: 2012-10-03 | Discharge: 2012-10-03 | Disposition: A | Discharge status: Home / Self Care | Payer: BC Managed Care – PPO | Source: Ambulatory Visit | Attending: General Surgery | Admitting: General Surgery

## 2012-10-03 ENCOUNTER — Encounter (HOSPITAL_COMMUNITY): Payer: Self-pay

## 2012-10-03 HISTORY — DX: Benign neoplasm of colon, unspecified: D12.6

## 2012-10-03 HISTORY — DX: Cardiac murmur, unspecified: R01.1

## 2012-10-03 LAB — CBC WITH DIFFERENTIAL/PLATELET
HCT: 40.4 % (ref 39.0–52.0)
Hemoglobin: 14.3 g/dL (ref 13.0–17.0)
Lymphocytes Relative: 27 % (ref 12–46)
MCHC: 35.4 g/dL (ref 30.0–36.0)
Monocytes Absolute: 0.5 10*3/uL (ref 0.1–1.0)
Monocytes Relative: 9 % (ref 3–12)
Neutro Abs: 3.7 10*3/uL (ref 1.7–7.7)
WBC: 5.9 10*3/uL (ref 4.0–10.5)

## 2012-10-03 LAB — BASIC METABOLIC PANEL
BUN: 15 mg/dL (ref 6–23)
CO2: 29 mEq/L (ref 19–32)
Chloride: 102 mEq/L (ref 96–112)
GFR calc Af Amer: >90 mL/min (ref >90)
Potassium: 4.1 mEq/L (ref 3.5–5.1)

## 2012-10-03 LAB — SURGICAL PCR SCREEN: MRSA, PCR: NEGATIVE

## 2012-10-03 NOTE — Patient Instructions (Addendum)
Tony Paul  10/03/2012                           YOUR PROCEDURE IS SCHEDULED ON:  10/09/12               PLEASE REPORT TO SHORT STAY CENTER AT :  7:30 AM               CALL THIS NUMBER IF ANY PROBLEMS THE DAY OF SURGERY :               [phone]                      REMEMBER:   Do not eat food or drink liquids AFTER MIDNIGHT   Take these medicines the morning of surgery with A SIP OF WATER:  NONE   Do not wear jewelry, make-up   Do not wear lotions, powders, or perfumes.   Do not shave legs or underarms 12 hrs. before surgery (men may shave face)  Do not bring valuables to the hospital.  Contacts, dentures or bridgework may not be worn into surgery.  Leave suitcase in the car. After surgery it may be brought to your room.  For patients admitted to the hospital more than one night, checkout time is 11:00                          The day of discharge.   Patients discharged the day of surgery will not be allowed to drive home                             If going home same day of surgery, must have someone stay with you first                           24 hrs at home and arrange for some one to drive you home from hospital.    Special Instructions:   Please read over the following fact sheets that you were given:               1. MRSA  INFORMATION                      2. [place] PREPARING FOR SURGERY SHEET               3. FOLLOW BOWEL PREP                                                X_____________________________________________________________________        Failure to follow these instructions may result in cancellation of your surgery

## 2012-10-09 ENCOUNTER — Encounter (HOSPITAL_COMMUNITY): Payer: Self-pay | Admitting: Anesthesiology

## 2012-10-09 ENCOUNTER — Encounter (HOSPITAL_COMMUNITY)
Admission: RE | Disposition: A | Discharge status: Home / Self Care | Payer: Self-pay | Source: Ambulatory Visit | Attending: General Surgery

## 2012-10-09 ENCOUNTER — Encounter (HOSPITAL_COMMUNITY): Payer: Self-pay | Admitting: *Deleted

## 2012-10-09 ENCOUNTER — Ambulatory Visit (HOSPITAL_COMMUNITY): Payer: BC Managed Care – PPO | Admitting: Anesthesiology

## 2012-10-09 ENCOUNTER — Inpatient Hospital Stay (HOSPITAL_COMMUNITY)
Admission: RE | Admit: 2012-10-09 | Discharge: 2012-10-14 | DRG: 147 | Disposition: A | Discharge status: Home / Self Care | Payer: BC Managed Care – PPO | Source: Ambulatory Visit | Attending: General Surgery | Admitting: General Surgery

## 2012-10-09 DIAGNOSIS — C19 Malignant neoplasm of rectosigmoid junction: Principal | ICD-10-CM | POA: Diagnosis present

## 2012-10-09 DIAGNOSIS — L719 Rosacea, unspecified: Secondary | ICD-10-CM | POA: Diagnosis present

## 2012-10-09 DIAGNOSIS — C189 Malignant neoplasm of colon, unspecified: Secondary | ICD-10-CM

## 2012-10-09 DIAGNOSIS — E785 Hyperlipidemia, unspecified: Secondary | ICD-10-CM | POA: Diagnosis present

## 2012-10-09 HISTORY — PX: LAPAROSCOPIC LOW ANTERIOR RESECTION: SHX5904

## 2012-10-09 LAB — CBC
HCT: 39.5 % (ref 39.0–52.0)
MCH: 30 pg (ref 26.0–34.0)
MCV: 84.8 fL (ref 78.0–100.0)
RBC: 4.66 MIL/uL (ref 4.22–5.81)
WBC: 15.6 10*3/uL — ABNORMAL HIGH (ref 4.0–10.5)

## 2012-10-09 LAB — TYPE AND SCREEN
ABO/RH(D): O POS
Antibody Screen: NEGATIVE

## 2012-10-09 LAB — CREATININE, SERUM: GFR calc Af Amer: >90 mL/min (ref >90)

## 2012-10-09 SURGERY — RESECTION, RECTUM, LOW ANTERIOR, LAPAROSCOPIC
Anesthesia: General | Site: Abdomen | Wound class: Clean Contaminated

## 2012-10-09 MED ORDER — ENOXAPARIN SODIUM 30 MG/0.3ML ~~LOC~~ SOLN
30.0000 mg | SUBCUTANEOUS | Status: DC
  Administered 2012-10-10 – 2012-10-13 (×4): 30 mg via SUBCUTANEOUS
  Filled 2012-10-09 (×5): qty 0.3

## 2012-10-09 MED ORDER — CHLORHEXIDINE GLUCONATE 4 % EX LIQD
1.0000 "application " | Freq: Once | CUTANEOUS | Status: DC
  Filled 2012-10-09: qty 15

## 2012-10-09 MED ORDER — HYDROMORPHONE HCL PF 1 MG/ML IJ SOLN
INTRAMUSCULAR | Status: DC | PRN
  Administered 2012-10-09 (×2): 1 mg via INTRAVENOUS
  Administered 2012-10-09 (×2): 0.5 mg via INTRAVENOUS

## 2012-10-09 MED ORDER — ONDANSETRON HCL 4 MG/2ML IJ SOLN
INTRAMUSCULAR | Status: DC | PRN
  Administered 2012-10-09: 4 mg via INTRAVENOUS

## 2012-10-09 MED ORDER — LACTATED RINGERS IV SOLN
INTRAVENOUS | Status: DC
  Administered 2012-10-09 (×2): via INTRAVENOUS
  Administered 2012-10-09: 1000 mL via INTRAVENOUS
  Administered 2012-10-10: via INTRAVENOUS

## 2012-10-09 MED ORDER — HEPARIN SODIUM (PORCINE) 5000 UNIT/ML IJ SOLN
5000.0000 [IU] | Freq: Once | INTRAMUSCULAR | Status: AC
  Administered 2012-10-09: 5000 [IU] via SUBCUTANEOUS
  Filled 2012-10-09: qty 1

## 2012-10-09 MED ORDER — HYDROMORPHONE HCL PF 1 MG/ML IJ SOLN
INTRAMUSCULAR | Status: AC
  Filled 2012-10-09: qty 1

## 2012-10-09 MED ORDER — NEOSTIGMINE METHYLSULFATE 1 MG/ML IJ SOLN
INTRAMUSCULAR | Status: DC | PRN
  Administered 2012-10-09: 4 mg via INTRAVENOUS

## 2012-10-09 MED ORDER — CEFOXITIN SODIUM-DEXTROSE 1-4 GM-% IV SOLR (PREMIX)
INTRAVENOUS | Status: AC
  Filled 2012-10-09: qty 100

## 2012-10-09 MED ORDER — 0.9 % SODIUM CHLORIDE (POUR BTL) OPTIME
TOPICAL | Status: DC | PRN
  Administered 2012-10-09 (×2): 1000 mL

## 2012-10-09 MED ORDER — BUPIVACAINE-EPINEPHRINE PF 0.25-1:200000 % IJ SOLN
INTRAMUSCULAR | Status: AC
  Filled 2012-10-09: qty 30

## 2012-10-09 MED ORDER — ROCURONIUM BROMIDE 100 MG/10ML IV SOLN
INTRAVENOUS | Status: DC | PRN
  Administered 2012-10-09: 40 mg via INTRAVENOUS
  Administered 2012-10-09: 20 mg via INTRAVENOUS
  Administered 2012-10-09: 10 mg via INTRAVENOUS
  Administered 2012-10-09: 20 mg via INTRAVENOUS
  Administered 2012-10-09: 10 mg via INTRAVENOUS

## 2012-10-09 MED ORDER — ACETAMINOPHEN 10 MG/ML IV SOLN
INTRAVENOUS | Status: DC | PRN
  Administered 2012-10-09: 1000 mg via INTRAVENOUS

## 2012-10-09 MED ORDER — ACETAMINOPHEN 10 MG/ML IV SOLN
INTRAVENOUS | Status: AC
  Filled 2012-10-09: qty 100

## 2012-10-09 MED ORDER — FENTANYL CITRATE 0.05 MG/ML IJ SOLN
INTRAMUSCULAR | Status: DC | PRN
  Administered 2012-10-09: 100 ug via INTRAVENOUS
  Administered 2012-10-09 (×3): 50 ug via INTRAVENOUS

## 2012-10-09 MED ORDER — PROMETHAZINE HCL 25 MG/ML IJ SOLN: 6.2500 mg | INTRAMUSCULAR | Status: DC | PRN

## 2012-10-09 MED ORDER — OXYCODONE HCL 5 MG PO TABS: 5.0000 mg | ORAL_TABLET | Freq: Once | ORAL | Status: DC | PRN

## 2012-10-09 MED ORDER — MEPERIDINE HCL 50 MG/ML IJ SOLN
INTRAMUSCULAR | Status: AC
  Filled 2012-10-09: qty 1

## 2012-10-09 MED ORDER — ACETAMINOPHEN 10 MG/ML IV SOLN
1000.0000 mg | Freq: Four times a day (QID) | INTRAVENOUS | Status: AC
  Administered 2012-10-09 – 2012-10-10 (×4): 1000 mg via INTRAVENOUS
  Filled 2012-10-09 (×6): qty 100

## 2012-10-09 MED ORDER — ONDANSETRON HCL 4 MG/2ML IJ SOLN: 4.0000 mg | Freq: Four times a day (QID) | INTRAMUSCULAR | Status: DC | PRN

## 2012-10-09 MED ORDER — DEXTROSE 5 % IV SOLN
2.0000 g | INTRAVENOUS | Status: AC
  Administered 2012-10-09: 2 g via INTRAVENOUS

## 2012-10-09 MED ORDER — LIDOCAINE HCL (CARDIAC) 20 MG/ML IV SOLN
INTRAVENOUS | Status: DC | PRN
  Administered 2012-10-09: 50 mg via INTRAVENOUS

## 2012-10-09 MED ORDER — OXYCODONE HCL 5 MG/5ML PO SOLN
5.0000 mg | Freq: Once | ORAL | Status: DC | PRN
  Filled 2012-10-09: qty 5

## 2012-10-09 MED ORDER — LACTATED RINGERS IV SOLN
INTRAVENOUS | Status: DC | PRN
  Administered 2012-10-09: 1000 mL

## 2012-10-09 MED ORDER — ALVIMOPAN 12 MG PO CAPS
12.0000 mg | ORAL_CAPSULE | Freq: Two times a day (BID) | ORAL | Status: DC
  Administered 2012-10-10 – 2012-10-13 (×7): 12 mg via ORAL
  Filled 2012-10-09 (×10): qty 1

## 2012-10-09 MED ORDER — HYDROMORPHONE HCL PF 1 MG/ML IJ SOLN
0.5000 mg | INTRAMUSCULAR | Status: DC | PRN
  Administered 2012-10-09 – 2012-10-10 (×6): 1 mg via INTRAVENOUS
  Filled 2012-10-09 (×7): qty 1

## 2012-10-09 MED ORDER — ALVIMOPAN 12 MG PO CAPS
12.0000 mg | ORAL_CAPSULE | Freq: Once | ORAL | Status: AC
  Administered 2012-10-09: 12 mg via ORAL
  Filled 2012-10-09: qty 1

## 2012-10-09 MED ORDER — BUPIVACAINE-EPINEPHRINE 0.25% -1:200000 IJ SOLN
INTRAMUSCULAR | Status: DC | PRN
  Administered 2012-10-09: 15 mL

## 2012-10-09 MED ORDER — MEPERIDINE HCL 50 MG/ML IJ SOLN
6.2500 mg | INTRAMUSCULAR | Status: DC | PRN
  Administered 2012-10-09: 12.5 mg via INTRAVENOUS

## 2012-10-09 MED ORDER — ACETAMINOPHEN 10 MG/ML IV SOLN: 1000.0000 mg | Freq: Once | INTRAVENOUS | Status: DC | PRN

## 2012-10-09 MED ORDER — MIDAZOLAM HCL 5 MG/5ML IJ SOLN
INTRAMUSCULAR | Status: DC | PRN
  Administered 2012-10-09: 2 mg via INTRAVENOUS

## 2012-10-09 MED ORDER — HYDROMORPHONE HCL PF 1 MG/ML IJ SOLN
0.2500 mg | INTRAMUSCULAR | Status: DC | PRN
  Administered 2012-10-09 (×3): 0.5 mg via INTRAVENOUS

## 2012-10-09 MED ORDER — HYDROMORPHONE HCL PF 1 MG/ML IJ SOLN
INTRAMUSCULAR | Status: AC
  Administered 2012-10-09: 1 mg
  Filled 2012-10-09: qty 1

## 2012-10-09 MED ORDER — GLYCOPYRROLATE 0.2 MG/ML IJ SOLN
INTRAMUSCULAR | Status: DC | PRN
  Administered 2012-10-09: .7 mg via INTRAVENOUS

## 2012-10-09 MED ORDER — ONDANSETRON HCL 4 MG PO TABS: 4.0000 mg | ORAL_TABLET | Freq: Four times a day (QID) | ORAL | Status: DC | PRN

## 2012-10-09 MED ORDER — PROPOFOL 10 MG/ML IV BOLUS
INTRAVENOUS | Status: DC | PRN
  Administered 2012-10-09: 150 mg via INTRAVENOUS

## 2012-10-09 SURGICAL SUPPLY — 83 items
APPLIER CLIP 5 13 M/L LIGAMAX5 (MISCELLANEOUS)
APPLIER CLIP ROT 10 11.4 M/L (STAPLE)
BLADE EXTENDED COATED 6.5IN (ELECTRODE) ×3 IMPLANT
BLADE HEX COATED 2.75 (ELECTRODE) ×3 IMPLANT
BLADE SURG SZ10 CARB STEEL (BLADE) IMPLANT
CABLE HIGH FREQUENCY MONO STRZ (ELECTRODE) ×3 IMPLANT
CANISTER SUCTION 2500CC (MISCELLANEOUS) ×3 IMPLANT
CANNULA ENDOPATH XCEL 11M (ENDOMECHANICALS) IMPLANT
CELLS DAT CNTRL 66122 CELL SVR (MISCELLANEOUS) ×2 IMPLANT
CLIP APPLIE 5 13 M/L LIGAMAX5 (MISCELLANEOUS) IMPLANT
CLIP APPLIE ROT 10 11.4 M/L (STAPLE) IMPLANT
CLOTH BEACON ORANGE TIMEOUT ST (SAFETY) ×3 IMPLANT
COVER MAYO STAND STRL (DRAPES) ×3 IMPLANT
DECANTER SPIKE VIAL GLASS SM (MISCELLANEOUS) ×3 IMPLANT
DERMABOND ADVANCED (GAUZE/BANDAGES/DRESSINGS) ×1
DERMABOND ADVANCED .7 DNX12 (GAUZE/BANDAGES/DRESSINGS) ×2 IMPLANT
DISSECTOR BLUNT TIP ENDO 5MM (MISCELLANEOUS) IMPLANT
DRAPE LAPAROSCOPIC ABDOMINAL (DRAPES) ×3 IMPLANT
DRAPE LG THREE QUARTER DISP (DRAPES) ×3 IMPLANT
DRAPE WARM FLUID 44X44 (DRAPE) ×3 IMPLANT
ELECT REM PT RETURN 9FT ADLT (ELECTROSURGICAL) ×3
ELECTRODE REM PT RTRN 9FT ADLT (ELECTROSURGICAL) ×2 IMPLANT
FILTER SMOKE EVAC LAPAROSHD (FILTER) IMPLANT
GLOVE BIO SURGEON STRL SZ 6 (GLOVE) ×9 IMPLANT
GLOVE BIOGEL PI IND STRL 6.5 (GLOVE) IMPLANT
GLOVE BIOGEL PI IND STRL 7.0 (GLOVE) ×8 IMPLANT
GLOVE BIOGEL PI IND STRL 7.5 (GLOVE) ×2 IMPLANT
GLOVE BIOGEL PI INDICATOR 6.5 (GLOVE)
GLOVE BIOGEL PI INDICATOR 7.0 (GLOVE) ×4
GLOVE BIOGEL PI INDICATOR 7.5 (GLOVE) ×1
GLOVE INDICATOR 6.5 STRL GRN (GLOVE) IMPLANT
GLOVE SS BIOGEL STRL SZ 7.5 (GLOVE) ×6 IMPLANT
GLOVE SUPERSENSE BIOGEL SZ 7.5 (GLOVE) ×3
GLOVE SURG SIGNA 7.5 PF LTX (GLOVE) ×6 IMPLANT
GLOVE SURG SS PI 6.5 STRL IVOR (GLOVE) ×12 IMPLANT
GOWN PREVENTION PLUS XXLARGE (GOWN DISPOSABLE) ×9 IMPLANT
KIT BASIN OR (CUSTOM PROCEDURE TRAY) ×3 IMPLANT
LEGGING LITHOTOMY PAIR STRL (DRAPES) ×3 IMPLANT
LIGASURE IMPACT 36 18CM CVD LR (INSTRUMENTS) IMPLANT
NS IRRIG 1000ML POUR BTL (IV SOLUTION) ×6 IMPLANT
PENCIL BUTTON HOLSTER BLD 10FT (ELECTRODE) ×3 IMPLANT
RTRCTR WOUND ALEXIS 18CM MED (MISCELLANEOUS) ×3
SCALPEL HARMONIC ACE (MISCELLANEOUS) IMPLANT
SCISSORS LAP 5X35 DISP (ENDOMECHANICALS) ×3 IMPLANT
SET IRRIG TUBING LAPAROSCOPIC (IRRIGATION / IRRIGATOR) ×3 IMPLANT
SHEET LAVH (DRAPES) IMPLANT
SOLUTION ANTI FOG 6CC (MISCELLANEOUS) ×3 IMPLANT
SPONGE GAUZE 4X4 12PLY (GAUZE/BANDAGES/DRESSINGS) IMPLANT
SPONGE LAP 18X18 X RAY DECT (DISPOSABLE) ×3 IMPLANT
STAPLER CIRC ILS CVD 33MM 37CM (STAPLE) ×3 IMPLANT
STAPLER CUT CVD 40MM GREEN (STAPLE) ×3 IMPLANT
STAPLER PROXIMATE 75MM BLUE (STAPLE) ×3 IMPLANT
STAPLER VISISTAT 35W (STAPLE) ×3 IMPLANT
SUCTION POOLE TIP (SUCTIONS) ×3 IMPLANT
SUT PDS AB 0 CTX 60 (SUTURE) IMPLANT
SUT PDS AB 1 CTX 36 (SUTURE) ×6 IMPLANT
SUT PROLENE 2 0 KS (SUTURE) ×3 IMPLANT
SUT PROLENE 2 0 SH DA (SUTURE) IMPLANT
SUT SILK 2 0 (SUTURE) ×1
SUT SILK 2 0 SH CR/8 (SUTURE) ×3 IMPLANT
SUT SILK 2-0 18XBRD TIE 12 (SUTURE) ×2 IMPLANT
SUT SILK 3 0 (SUTURE)
SUT SILK 3 0 SH CR/8 (SUTURE) ×3 IMPLANT
SUT SILK 3-0 18XBRD TIE 12 (SUTURE) IMPLANT
SUT VIC AB 2-0 SH 18 (SUTURE) ×3 IMPLANT
SUT VIC AB 2-0 SH 27 (SUTURE) ×1
SUT VIC AB 2-0 SH 27X BRD (SUTURE) ×2 IMPLANT
SUT VIC AB 3-0 SH 18 (SUTURE) IMPLANT
SUT VIC AB 3-0 SH 27 (SUTURE) ×1
SUT VIC AB 3-0 SH 27X BRD (SUTURE) ×2 IMPLANT
SUT VIC AB 4-0 PS2 18 (SUTURE) ×3 IMPLANT
SUT VICRYL 2 0 18  UND BR (SUTURE)
SUT VICRYL 2 0 18 UND BR (SUTURE) IMPLANT
TOWEL OR 17X26 10 PK STRL BLUE (TOWEL DISPOSABLE) ×9 IMPLANT
TOWEL OR NON WOVEN STRL DISP B (DISPOSABLE) ×3 IMPLANT
TRAY FOLEY CATH 14FRSI W/METER (CATHETERS) ×3 IMPLANT
TRAY LAP CHOLE (CUSTOM PROCEDURE TRAY) ×3 IMPLANT
TROCAR BLADELESS OPT 5 75 (ENDOMECHANICALS) ×9 IMPLANT
TROCAR XCEL BLUNT TIP 100MML (ENDOMECHANICALS) ×3 IMPLANT
TROCAR XCEL NON-BLD 11X100MML (ENDOMECHANICALS) IMPLANT
TUBING CONNECTING 10 (TUBING) ×3 IMPLANT
YANKAUER SUCT BULB TIP 10FT TU (MISCELLANEOUS) ×3 IMPLANT
YANKAUER SUCT BULB TIP NO VENT (SUCTIONS) ×3 IMPLANT

## 2012-10-09 NOTE — Progress Notes (Signed)
Decreased fluids to 42ml/hr Jw Covin RN

## 2012-10-09 NOTE — H&P (View-Only) (Signed)
Chief Complaint  Patient presents with  . Colon Polyps    eval colon polyp    HISTORY: Tony Paul is a 51 y.o. male who presents to the office with a mass found on colonoscopy.  This was found on colonoscopy. He denies any changes in his bowel habits or abd pain.  He denies any bloody bowel movements.  He denies any weight loss or changes in his appetite.    Past Medical History  Diagnosis Date  . HLD (hyperlipidemia)   . Rosacea   . Dyspnea     on exertion  . Health maintenance examination       Past Surgical History  Procedure Date  . Tonsillectomy 1971  . Colonoscopy       Current Outpatient Prescriptions  Medication Sig Dispense Refill  . Multiple Vitamins-Iron (MULTIVITAMIN/IRON) TABS Take by mouth daily.        . Omega-3 Fatty Acids (FISH OIL) 1000 MG CAPS Take by mouth 3 (three) times daily. (sometimes 4)       . atorvastatin (LIPITOR) 20 MG tablet Take 1 tablet (20 mg total) by mouth daily.  30 tablet  6  . ezetimibe (ZETIA) 10 MG tablet Take 1 tablet (10 mg total) by mouth daily.  30 tablet  3      No Known Allergies    Family History  Problem Relation Age of Onset  . Heart failure Father   . Hypertension Other   . Hyperlipidemia Other   . Kidney disease Other   . Colon cancer Neg Hx   . Prostate cancer Neg Hx       History   Social History  . Marital Status: Married    Spouse Name: N/A    Number of Children: N/A  . Years of Education: N/A   Social History Main Topics  . Smoking status: Never Smoker   . Smokeless tobacco: Never Used  . Alcohol Use: 0.6 oz/week    1 Glasses of wine per week     Comment: some weeks none  . Drug Use: No  . Sexually Active: Not on file   Other Topics Concern  . 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  Well nourished and well groomed.   Neurological: Alert and oriented to person, place, and time. Coordination normal.  Head: Normocephalic and atraumatic.  Eyes: Conjunctivae are normal. Pupils are equal, round, and reactive to light. No scleral icterus.  Neck: Normal range of motion. Neck supple. No tracheal deviation or thyromegaly present.  No cervical lymphadenopathy. Cardiovascular: Normal rate, regular rhythm, normal heart sounds and intact distal pulses.  Exam reveals no gallop and no friction rub.  No murmur heard. Respiratory: Effort normal.  No respiratory distress. No chest wall tenderness. Breath sounds normal.  No wheezes, rales or  rhonchi.  GI: Soft. Bowel sounds are normal. The abdomen is soft and nontender.  There is no rebound and no guarding.  Musculoskeletal: Normal range of motion. Extremities are nontender.  Skin: Skin is warm and dry. No rash noted. No diaphoresis. No erythema. No pallor. No clubbing, cyanosis, or edema.   Psychiatric: Normal mood and affect. Behavior is normal. Judgment and thought content normal.     LABORATORY RESULTS: Available labs are reviewed CEA: pend   ASSESSMENT AND PLAN: Carey Johndrow is a 51 y.o. M who was referred to me from Dr Rhea Belton after a large sessile sigmoid mass was found on colonoscopy.  This was felt to be unresectable endoscopically.  He is here to discuss surgical resection.  I discussed with them Dr Lauro Franklin concern that this is a colon cancer underneath of the are that  he biopsied.  I discussed the surgical procedure in detail and answered all questions.  I will schedule him for a laparoscopic sigmoidectomy.  He has instructions for his bowel prep.  We will try to get this completed as soon as possible.  The surgery and anatomy were described to the patient as well as the risks of surgery and the possible complications.  These include: Bleeding, infection and possible wound complications such as hernia, damage to adjacent structures, leak of surgical connections, which can lead to other surgeries and possibly an ostomy (5-7%), possible need for other procedures, such as abscess drains in radiology, possible prolonged hospital stay, possible diarrhea from removal of part of the colon, possible constipation from narcotics, prolonged fatigue/weakness or appetite loss, possible early recurrence of cancer, possible complications of their medical problems such as heart disease or arrhythmias or lung problems, death (less than 1%). I believe the patient understands and wishes to proceed with the surgery.     Vanita Panda, MD Colon and Rectal Surgery / General Surgery [facility]      Visit Diagnoses: No diagnosis found.  Primary Care Physician: Letitia Libra, Ala Dach, MD

## 2012-10-09 NOTE — Interval H&P Note (Signed)
History and Physical Interval Note:  10/09/2012 10:20 AM  Tony Paul  has presented today for surgery, with the diagnosis of colon polyp  The various methods of treatment have been discussed with the patient and family. After consideration of risks, benefits and other options for treatment, the patient has consented to  Procedure(s) (LRB) with comments: LAPAROSCOPIC SIGMOID COLECTOMY (N/A) - Laparoscopic Sigmoidectomy as a surgical intervention .  The patient's history has been reviewed, patient examined, no change in status, stable for surgery.  I have reviewed the patient's chart and labs.  Questions were answered to the patient's satisfaction.     Vanita Panda, MD  Colorectal and General Surgery [facility]

## 2012-10-09 NOTE — Op Note (Signed)
10/09/2012  7:30 PM  PATIENT:  Tony Paul  51 y.o. male  Patient Care Team: Edwyna Perfect, MD as PCP - General (Internal Medicine)  PRE-OPERATIVE DIAGNOSIS:  colon polyp  POST-OPERATIVE DIAGNOSIS:  colon polyp  PROCEDURE:  Procedure(s): LAPAROSCOPIC LOW ANTERIOR RESECTION Splenic flexure mobilization  SURGEON:  Surgeon(s): Romie Levee, MD  ASSISTANT: Magnus Ivan   ANESTHESIA:   general  EBL:    Delay start of Pharmacological VTE agent (>24hrs) due to surgical blood loss or risk of bleeding:  no  DRAINS: none   SPECIMEN:  Source of Specimen:  Rectosigmoid  DISPOSITION OF SPECIMEN:  PATHOLOGY  COUNTS:  YES  PLAN OF CARE: Admit to inpatient   PATIENT DISPOSITION:  PACU - hemodynamically stable.  INDICATION: this is a 51 year old male who underwent routine screening colonoscopy and was found to have a mass in the sigmoid colon. Biopsy showed tubulovillous adenoma but mass was extremely concerning due to appearance at colonoscopy  OR FINDINGS: Mass at rectosigmoid junction, fixed  DESCRIPTION: the patient was identified in the preoperative holding area and taken to the OR where he was laid supine on the operating room table. General anesthesia was smoothly induced. He was placed in lithotomy position and a Foley catheter was inserted under sterile conditions. Surgical timeout was performed indicating the correct patient, positioning, preoperative antibiotics. SCDs were also noted to be in place prior to the initiation of anesthesia. The patient's abdomen was prepped and draped in usual sterile fashion. A vertical infraumbilical incision was made using a 15 blade scalpel. Subcutaneous tissues were dissected bluntly. The fascia was elevated using 2 Kocher clamps. The fascia was incised in midline using a scalpel. The peritoneum was entered bluntly. A Hassan port was placed and the abdomen was insufflated to approximately 15 mm mercury.  A suprapubic 5 mm port was placed  under direct visualization. 5 mm ports were also placed in the right and left lower quadrants under direct visualization.  I began by evaluating the abdomen. There was no signs of carcinomatosis. There are no signs of liver metastases. There was a tattoo at the rectosigmoid junction. The sigmoid was elevated and I began my medial dissection. The mesenteric plane was opened using sharp dissection.  I dissected towards the lateral side wall until the ureter could be identified and preserved. I then continued my dissection towards the splenic flexure. The sigmoid artery and vein were identified and separated using blunt dissection. These were transected using high ligation and the Enseal device.  Given the area of the tattoo I had to enter the presacral plane to dissect down far enough to get a good margin. This was done using blunt dissection. I dissected down to approximate 5 cm below the area of tattoo. After this was completed it was apparent that I would need to mobilize the splenic flexure.  This was done using blunt dissection. The splenocolic ligament was taken using the Enseal device. I then separated the transverse colon from the omentum on the left side using the Enseal device. Once the entire splenic flexure was mobilized, the remaining colon easily reached into the pelvis.  After this I made a Pfannenstiel incision approximately 2 cm proximal to the pubic bone. This was carried down through the subcutaneous tissue to the level of the fascia. Fascia was incised. The muscle was split at midline and the peritoneum was entered. A wound protector device was placed. The colon was elevated. The sigmoid colon was transected using a GIA blue load stapler.  I dissected out the mesorectum approximately 5 cm distal to the tumor using Bovie electrocautery for hemostasis. I then transected the rectum using a green load contour device. The specimen was then sent to pathology for further examination. After this was  completed the remaining proximal colon was brought up through our incision. The staple line was transected using electrocautery. The EEA sizers were used to evaluate the colon. A 33 mm EEA stapler was chosen. The anvil was placed into the proximal colon. A pursestring device was used to create a pursestring suture with a 2-0 Prolene. This was secured into place with 3-0 silk sutures. The Prolene was tied to the ample and placed back in to the abdomen. The EEA stapler was then placed into the rectum and brought out through the staple line. The anastomosis was created. There was no tension. The anastomosis was checked underwater with a proctoscope. There was no staple line bleeding. There was no leak. The rectal contents were evacuated and the abdomen was checked for hemostasis. After this was completed the umbilical incision was closed using a 2-0 Vicryl suture. The peritoneum was then closed using a running 2-0 Vicryl suture. The fascia was closed using 2-0 PDS sutures. The subcutaneous tissue was irrigated with normal saline. Scarpa's fascia was closed using interrupted 2-0 Vicryl sutures. The skin was then closed using 4-0 Vicryl suture and Dermabond. The patient was then awakened from anesthesia and sent to the postanesthesia care in stable condition. All counts were correct operating room staff.

## 2012-10-09 NOTE — Anesthesia Postprocedure Evaluation (Signed)
Anesthesia Post Note  Patient: Tony Paul  Procedure(s) Performed: Procedure(s) (LRB): LAPAROSCOPIC LOW ANTERIOR RESECTION (N/A)  Anesthesia type: General  Patient location: PACU  Post pain: Pain level controlled  Post assessment: Post-op Vital signs reviewed  Last Vitals: BP 134/73  Pulse 83  Temp 37.2 C  Resp 9  SpO2 100%  Post vital signs: Reviewed  Level of consciousness: sedated  Complications: No apparent anesthesia complications

## 2012-10-09 NOTE — Anesthesia Preprocedure Evaluation (Addendum)
Anesthesia Evaluation  Patient identified by MRN, date of birth, ID band Patient awake    Reviewed: Allergy & Precautions, H&P , NPO status , Patient's Chart, lab work & pertinent test results  Airway Mallampati: II TM Distance: >3 FB Neck ROM: Full    Dental  (+) Dental Advisory Given and Teeth Intact   Pulmonary shortness of breath and with exertion,  breath sounds clear to auscultation  Pulmonary exam normal       Cardiovascular - Past MI and - CHF Rhythm:Regular Rate:Normal  Stress Echo 07/2009   Study Conclusions  - Stress ECG conclusions: There were no stress arrhythmias or   conduction abnormalities. The stress ECG was negative for  ischemia.  - Staged echo: There was no echocardiographic evidence for stress-induced ischemia.    Neuro/Psych negative neurological ROS  negative psych ROS   GI/Hepatic negative GI ROS, Neg liver ROS,   Endo/Other  negative endocrine ROS  Renal/GU negative Renal ROS     Musculoskeletal negative musculoskeletal ROS (+)   Abdominal   Peds  Hematology  (+) Blood dyscrasia, ,   Anesthesia Other Findings   Reproductive/Obstetrics                         Anesthesia Physical Anesthesia Plan  ASA: II  Anesthesia Plan: General   Post-op Pain Management:    Induction: Intravenous  Airway Management Planned: Oral ETT  Additional Equipment:   Intra-op Plan:   Post-operative Plan: Extubation in OR  Informed Consent: I have reviewed the patients History and Physical, chart, labs and discussed the procedure including the risks, benefits and alternatives for the proposed anesthesia with the patient or authorized representative who has indicated his/her understanding and acceptance.   Dental advisory given  Plan Discussed with: CRNA  Anesthesia Plan Comments:         Anesthesia Quick Evaluation

## 2012-10-09 NOTE — Transfer of Care (Signed)
Immediate Anesthesia Transfer of Care Note  Patient: Tony Paul  Procedure(s) Performed: Procedure(s) (LRB): LAPAROSCOPIC LOW ANTERIOR RESECTION (N/A)  Patient Location: PACU  Anesthesia Type: General  Level of Consciousness: sedated, patient cooperative and responds to stimulaton  Airway & Oxygen Therapy: Patient Spontanous Breathing and Patient connected to face mask oxgen  Post-op Assessment: Report given to PACU RN and Post -op Vital signs reviewed and stable  Post vital signs: Reviewed and stable  Complications: No apparent anesthesia complications

## 2012-10-10 ENCOUNTER — Encounter (HOSPITAL_COMMUNITY): Payer: Self-pay | Admitting: General Surgery

## 2012-10-10 DIAGNOSIS — C189 Malignant neoplasm of colon, unspecified: Secondary | ICD-10-CM

## 2012-10-10 LAB — CBC
HCT: 38.8 % — ABNORMAL LOW (ref 39.0–52.0)
Hemoglobin: 13 g/dL (ref 13.0–17.0)
MCV: 86.4 fL (ref 78.0–100.0)
WBC: 10.2 10*3/uL (ref 4.0–10.5)

## 2012-10-10 MED ORDER — KCL IN DEXTROSE-NACL 20-5-0.45 MEQ/L-%-% IV SOLN
INTRAVENOUS | Status: DC
  Administered 2012-10-10: 11:00:00 via INTRAVENOUS
  Administered 2012-10-11: 50 mL/h via INTRAVENOUS
  Filled 2012-10-10 (×2): qty 1000

## 2012-10-10 MED ORDER — OXYCODONE-ACETAMINOPHEN 5-325 MG PO TABS
1.0000 | ORAL_TABLET | ORAL | Status: DC | PRN
  Administered 2012-10-10: 1 via ORAL
  Administered 2012-10-11 – 2012-10-14 (×16): 2 via ORAL
  Filled 2012-10-10 (×7): qty 2
  Filled 2012-10-10: qty 1
  Filled 2012-10-10 (×9): qty 2

## 2012-10-10 NOTE — Progress Notes (Signed)
Gave report to Jess at 2300 on patient Jovann Luse RN

## 2012-10-10 NOTE — Progress Notes (Signed)
1 Day Post-Op Laparoscopic LAR Subjective: Did well overnight.  Some nausea overnight but none now.  No flatus.  Foley feels uncomfortable.  Minimal pain.    Objective: Vital signs in last 24 hours: Temp:  [97.6 F (36.4 C)-99.2 F (37.3 C)] 98.9 F (37.2 C) (12/20 0208) Pulse Rate:  [77-103] 84  (12/20 0208) Resp:  [8-19] 18  (12/20 0208) BP: (108-142)/(67-82) 108/70 mmHg (12/20 0208) SpO2:  [96 %-100 %] 97 % (12/20 0208) Weight:  [185 lb (83.915 kg)] 185 lb (83.915 kg) (12/19 1600)   Intake/Output from previous day: 12/19 0701 - 12/20 0700 In: 4479.1 [P.O.:1702; I.V.:2677.1; IV Piggyback:100] Out: 1575 [Urine:1525; Blood:50] Intake/Output this shift:     General appearance: alert and cooperative Resp: breathing nonlabored Cardio: regular rate and rhythm GI: normal findings: soft, non-tender  Incision: no significant drainage, no significant erythema  Lab Results:   Basename 10/10/12 0453 10/09/12 1513  WBC 10.2 15.6*  HGB 13.0 14.0  HCT 38.8* 39.5  PLT 178 195   BMET  Basename 10/09/12 1513  NA --  K --  CL --  CO2 --  GLUCOSE --  BUN --  CREATININE 0.98  CALCIUM --   PT/INR No results found for this basename: LABPROT:2,INR:2 in the last 72 hours ABG No results found for this basename: PHART:2,PCO2:2,PO2:2,HCO3:2 in the last 72 hours  MEDS, Scheduled    . acetaminophen  1,000 mg Intravenous Q6H  . alvimopan  12 mg Oral BID  . enoxaparin (LOVENOX) injection  30 mg Subcutaneous Q24H    Studies/Results: No results found.  Assessment: s/p Procedure(s): LAPAROSCOPIC LOW ANTERIOR RESECTION Patient Active Problem List  Diagnosis  . HYPERLIPIDEMIA  . ROSACEA  . Annual physical exam  . Tubulovillous adenoma of colon    Doing as expected  Plan: d/c foley Advance diet to clears, decrease MIV to 90ml/h ambulate Incentive spirometry SCDs and lovenox for dvt prohylaxis   LOS: 1 day     .Vanita Panda, MD [facility] Surgery,  [place] [phone]   10/10/2012 8:37 AM

## 2012-10-10 NOTE — Progress Notes (Signed)
Spoke to Dr. Maisie Fus informed her that patient and RN that emptied stool reports several bloody stools, MD states that is expected, and will put in order to begin percocet for pain now that patient is doing a full liquid diet.

## 2012-10-11 LAB — BASIC METABOLIC PANEL
BUN: 5 mg/dL — ABNORMAL LOW (ref 6–23)
CO2: 28 mEq/L (ref 19–32)
Chloride: 103 mEq/L (ref 96–112)
Creatinine, Ser: 0.9 mg/dL (ref 0.50–1.35)
Glucose, Bld: 111 mg/dL — ABNORMAL HIGH (ref 70–99)

## 2012-10-11 LAB — CBC
HCT: 36.5 % — ABNORMAL LOW (ref 39.0–52.0)
MCV: 86.5 fL (ref 78.0–100.0)
RBC: 4.22 MIL/uL (ref 4.22–5.81)
WBC: 8.2 10*3/uL (ref 4.0–10.5)

## 2012-10-11 NOTE — Progress Notes (Signed)
2 Days Post-Op Laparoscopic LAR Subjective: Did well overnight.  Some pain overnight but controlled with PO meds.  Positive flatus.  A few small, bloody BM's yesterday. Foley out and urinating well.  Ambulating well  Objective: Vital signs in last 24 hours: Temp:  [98.5 F (36.9 C)-99.3 F (37.4 C)] 99.1 F (37.3 C) (12/21 0550) Pulse Rate:  [86-87] 87  (12/21 0550) Resp:  [18] 18  (12/21 0550) BP: (109-125)/(71-85) 109/71 mmHg (12/21 0550) SpO2:  [95 %-98 %] 97 % (12/21 0550)   Intake/Output from previous day: 12/20 0701 - 12/21 0700 In: 1967.5 [P.O.:720; I.V.:1247.5] Out: 3450 [Urine:3450] Intake/Output this shift: Total I/O In: 360 [P.O.:360] Out: 300 [Urine:300]  General appearance: alert and cooperative Resp: breathing nonlabored Cardio: regular rate and rhythm GI: normal findings: soft, non-tender  Incision: no significant drainage, no significant erythema, some bruising noted  Lab Results:   Basename 10/11/12 0444 10/10/12 0453  WBC 8.2 10.2  HGB 12.4* 13.0  HCT 36.5* 38.8*  PLT 174 178   BMET  Basename 10/11/12 0444 10/09/12 1513  NA 138 --  K 3.8 --  CL 103 --  CO2 28 --  GLUCOSE 111* --  BUN 5* --  CREATININE 0.90 0.98  CALCIUM 8.9 --   PT/INR No results found for this basename: LABPROT:2,INR:2 in the last 72 hours ABG No results found for this basename: PHART:2,PCO2:2,PO2:2,HCO3:2 in the last 72 hours  MEDS, Scheduled    . alvimopan  12 mg Oral BID  . enoxaparin (LOVENOX) injection  30 mg Subcutaneous Q24H    Studies/Results: No results found.  Assessment: s/p Procedure(s): LAPAROSCOPIC LOW ANTERIOR RESECTION Patient Active Problem List  Diagnosis  . HYPERLIPIDEMIA  . ROSACEA  . Annual physical exam  . Tubulovillous adenoma of colon  . Colon cancer    Doing as expected.  Path shows T3N0 tumor.  CEA normal.  Will get staging CT's and refer to oncology after he heals from surgery.  Plan: Cont full liquid  diet ambulate Incentive spirometry SCDs and lovenox for dvt prohylaxis   LOS: 2 days     .Vanita Panda, MD [facility] Surgery, [place] [phone]   10/11/2012 10:01 AM

## 2012-10-12 NOTE — Progress Notes (Signed)
3 Days Post-Op Laparoscopic LAR Subjective: Did well overnight.  Some pain overnight but controlled with PO meds.  Minimal flatus flatus. Ambulating well.  No nausea.  Tolerating full liquids.    Objective: Vital signs in last 24 hours: Temp:  [98 F (36.7 C)-99.2 F (37.3 C)] 98 F (36.7 C) (12/22 0603) Pulse Rate:  [76-89] 81  (12/22 0603) Resp:  [18-20] 18  (12/22 0603) BP: (108-119)/(70-83) 119/83 mmHg (12/22 0603) SpO2:  [96 %-99 %] 96 % (12/22 0603)   Intake/Output from previous day: 12/21 0701 - 12/22 0700 In: 720 [P.O.:720] Out: 1925 [Urine:1925] Intake/Output this shift:   General appearance: alert and cooperative Resp: breathing nonlabored Cardio: regular rate and rhythm GI: normal findings: soft, non-tender  Incision: no significant drainage, no significant erythema, some bruising noted  Lab Results:   [facility] 10/11/12 0444 10/10/12 0453  WBC 8.2 10.2  HGB 12.4* 13.0  HCT 36.5* 38.8*  PLT 174 178   BMET  Basename 10/11/12 0444 10/09/12 1513  NA 138 --  K 3.8 --  CL 103 --  CO2 28 --  GLUCOSE 111* --  BUN 5* --  CREATININE 0.90 0.98  CALCIUM 8.9 --    MEDS, Scheduled    . alvimopan  12 mg Oral BID  . enoxaparin (LOVENOX) injection  30 mg Subcutaneous Q24H    Studies/Results: No results found.  Assessment: s/p Procedure(s): LAPAROSCOPIC LOW ANTERIOR RESECTION Patient Active Problem List  Diagnosis  . HYPERLIPIDEMIA  . ROSACEA  . Annual physical exam  . Tubulovillous adenoma of colon  . Colon cancer    Doing as expected.  Path shows T3N0 tumor.  CEA normal.  Will get staging CT's and refer to oncology after he heals from surgery.  No real bowel function yet.  Plan: Cont full liquid diet.  If he has a BM or significant flatus, can advance to reg diet later today ambulate Incentive spirometry SCDs and lovenox for dvt prohylaxis   LOS: 3 days     .Vanita Panda, MD [facility] Surgery,  [place] [phone]   10/12/2012 9:34 AM

## 2012-10-13 MED ORDER — DOCUSATE SODIUM 100 MG PO CAPS
100.0000 mg | ORAL_CAPSULE | Freq: Two times a day (BID) | ORAL | Status: DC
  Administered 2012-10-13 (×2): 100 mg via ORAL
  Filled 2012-10-13 (×4): qty 1

## 2012-10-13 MED ORDER — OXYCODONE-ACETAMINOPHEN 5-325 MG PO TABS: 1.0000 | ORAL_TABLET | ORAL | Status: DC | PRN

## 2012-10-13 MED ORDER — DSS 100 MG PO CAPS: 100.0000 mg | ORAL_CAPSULE | Freq: Two times a day (BID) | ORAL | Status: DC

## 2012-10-13 NOTE — Discharge Summary (Signed)
Physician Discharge Summary  Patient ID: Tony Paul MRN: [id number] DOB/AGE: Aug 25, 1961 51 y.o.  Admit date: 10/09/2012 Discharge date: 10/13/2012  Admission Diagnoses: Tubulovillous adenoma  Discharge Diagnoses:  Principal Problem:  *Colon cancer   Discharged Condition: good  Hospital Course: The patient underwent a LAR for recto sigmoid tumor.  His postoperative course was uneventful except for a few small bloody bowel movements on POD 1 and 2.  His diet was advanced gradually.  He was discharged home after tolerating a regular diet and oral pain medications.  He was urinating without difficulty and ambulating well.  He did have a bowel movement prior to discharge.  Consults: None  Significant Diagnostic Studies: labs: cbc, chemsitries  Treatments: IV hydration, analgesia: acetaminophen w/ codeine and surgery: LAR  Discharge Exam: Blood pressure 129/87, pulse 77, temperature 98.3 F (36.8 C), temperature source Oral, resp. rate 20, height 5\' 10"  (1.778 m), weight 185 lb (83.915 kg), SpO2 96.00%. General appearance: alert and cooperative GI: soft, appropriately tender  Disposition: 01-Home or Self Care  Discharge Orders    Future Appointments: Provider: Department: Dept Phone: Center:   10/28/2012 9:30 AM Romie Levee, MD [facility] Surgery, [place] [phone] None   12/30/2012 8:15 AM Edwyna Perfect, MD [facility] [phone] [id number]       Medication List     As of 10/13/2012  8:29 AM    TAKE these medications         aspirin EC 81 MG tablet   Take 81 mg by mouth daily as needed. On occasions      DSS 100 MG Caps   Take 100 mg by mouth 2 (two) times daily.      fish oil-omega-3 fatty acids 1000 MG capsule   Take 3-4 g by mouth daily as needed. On occasion      oxyCODONE-acetaminophen 5-325 MG per tablet   Commonly known as: PERCOCET/ROXICET   Take 1-2 tablets by mouth every 4 (four) hours as needed.          Signed: Kaipo Ardis C. 10/13/2012, 8:29 AM

## 2012-10-13 NOTE — Progress Notes (Signed)
4 Days Post-Op Laparoscopic LAR Subjective: Did well overnight.  Some pain and burning in his L inguinal region, feels better with ice.  Much more flatus today. Ambulating well.  No nausea.  Tolerating full liquids.    Objective: Vital signs in last 24 hours: Temp:  [98.1 F (36.7 C)-98.6 F (37 C)] 98.3 F (36.8 C) (12/23 0440) Pulse Rate:  [77-91] 77  (12/23 0440) Resp:  [16-20] 20  (12/23 0440) BP: (123-140)/(78-87) 129/87 mmHg (12/23 0440) SpO2:  [96 %-100 %] 96 % (12/23 0440)   Intake/Output from previous day: 12/22 0701 - 12/23 0700 In: 1200 [P.O.:1200] Out: 2675 [Urine:2675] Intake/Output this shift:   General appearance: alert and cooperative Resp: breathing nonlabored Cardio: regular rate and rhythm GI: normal findings: soft, non-tender L inguinal region with no hernia Incision: no significant drainage, no significant erythema, some bruising noted  Lab Results:   [facility] 10/11/12 0444  WBC 8.2  HGB 12.4*  HCT 36.5*  PLT 174   BMET  Basename 10/11/12 0444  NA 138  K 3.8  CL 103  CO2 28  GLUCOSE 111*  BUN 5*  CREATININE 0.90  CALCIUM 8.9    MEDS, Scheduled    . alvimopan  12 mg Oral BID  . docusate sodium  100 mg Oral BID  . enoxaparin (LOVENOX) injection  30 mg Subcutaneous Q24H    Studies/Results: No results found.  Assessment: s/p Procedure(s): LAPAROSCOPIC LOW ANTERIOR RESECTION Patient Active Problem List  Diagnosis  . HYPERLIPIDEMIA  . ROSACEA  . Annual physical exam  . Tubulovillous adenoma of colon  . Colon cancer    Doing as expected.  Path shows T3N0 tumor.  CEA normal.  Will get staging CT's and refer to oncology after he heals from surgery. Starting to have colon function Plan: Reg diet ambulate Incentive spirometry SCDs and lovenox for dvt prohylaxis D/c home if he has a BM   LOS: 4 days     .Vanita Panda, MD [facility] Surgery, [place] [phone]   10/13/2012 8:33 AM

## 2012-10-19 ENCOUNTER — Other Ambulatory Visit (INDEPENDENT_AMBULATORY_CARE_PROVIDER_SITE_OTHER): Payer: Self-pay | Admitting: General Surgery

## 2012-10-20 ENCOUNTER — Telehealth (INDEPENDENT_AMBULATORY_CARE_PROVIDER_SITE_OTHER): Payer: Self-pay

## 2012-10-20 NOTE — Telephone Encounter (Signed)
See Dr Allene Pyo message below where he called in pain medicine.

## 2012-10-20 NOTE — Telephone Encounter (Signed)
Message copied by Ivory Broad on Mon Oct 20, 2012  8:50 AM ------      Message from: Littie Deeds      Created: Mon Oct 20, 2012  8:20 AM      Regarding: FW: called in pain meds                   ----- Message -----         From: Kandis Cocking, MD         Sent: 10/18/2012   2:54 PM           To: [facility] Clinical Pool      Subject: called in pain meds                                      I called in Vicodin #20 for Mr. Silliman on Saturday, 12/28.            I called this into [facility] - [phone].            I spoke to his wife.            Thanks,      Onalee Hua

## 2012-10-23 ENCOUNTER — Telehealth (INDEPENDENT_AMBULATORY_CARE_PROVIDER_SITE_OTHER): Payer: Self-pay | Admitting: General Surgery

## 2012-10-23 NOTE — Telephone Encounter (Signed)
Patient called this morning and stated that he is having pain the groin area. He stated that he has been moving around more during the holidays. He stated that he has done heat and ice in this area. I told him to do ice and do his pain meds and keep his feet up. And If the pain gets worse to call us . He has a apt on 10-28-12 to see Dr Maisie Fus

## 2012-10-24 ENCOUNTER — Ambulatory Visit
Admission: RE | Admit: 2012-10-24 | Discharge: 2012-10-24 | Disposition: A | Discharge status: Home / Self Care | Payer: BC Managed Care – PPO | Source: Ambulatory Visit | Attending: General Surgery | Admitting: General Surgery

## 2012-10-24 ENCOUNTER — Telehealth (INDEPENDENT_AMBULATORY_CARE_PROVIDER_SITE_OTHER): Payer: Self-pay

## 2012-10-24 ENCOUNTER — Telehealth (INDEPENDENT_AMBULATORY_CARE_PROVIDER_SITE_OTHER): Payer: Self-pay | Admitting: General Surgery

## 2012-10-24 ENCOUNTER — Other Ambulatory Visit (INDEPENDENT_AMBULATORY_CARE_PROVIDER_SITE_OTHER): Payer: Self-pay

## 2012-10-24 DIAGNOSIS — C189 Malignant neoplasm of colon, unspecified: Secondary | ICD-10-CM

## 2012-10-24 MED ORDER — IOHEXOL 300 MG/ML  SOLN
100.0000 mL | Freq: Once | INTRAMUSCULAR | Status: AC | PRN
  Administered 2012-10-24: 100 mL via INTRAVENOUS

## 2012-10-24 NOTE — Telephone Encounter (Signed)
Pt called to report problems with pain control.  Took the original Percocet, then the Vicodin ( #20 called in by Dr. Ezzard Standing on 10/18/12.)  He reports that as long as he is up and moving around/ walking, the pain is not too bad. But if he sits or lays down and even just stands still, the pain increases.  Paged and updated Dr. Maisie Fus.  She ordered a CT scan with po and IV contrast of the chest, abdomen and pelvis.  Also OK to refill protocol Vicodin:  Called Hydrocodone 5/325 mg, # 30, 1-2 po Q4-6H prn pain, no refill to [facility] [place]:  [phone].  Pt aware to augment his narcotic meds with ibuprofen 600 mg Q6H as needed.

## 2012-10-24 NOTE — Telephone Encounter (Signed)
I called and spoke to the wife and gave her the ct information.  It is today at [address] [facility].  He should arrive at 12:30.  He should not eat any solid food right now but just liquids.  They will have him drink the contrast when he arrives.

## 2012-10-24 NOTE — Telephone Encounter (Signed)
I had Dr Donell Beers look at the ct scan result and she said it looked normal with nothing concerning on there.  She said to make sure he wasn't constipated.  I called the pt and let him know.  He was in a recliner and he said as he tries to get up it hurts his back.  He was also having burning pain in his groin.  I said there is no sign of hernia on his scan.  He is moving his bowels ok.  I told him we will check him on Tuesday 10/28/12.

## 2012-10-27 ENCOUNTER — Telehealth (INDEPENDENT_AMBULATORY_CARE_PROVIDER_SITE_OTHER): Payer: Self-pay

## 2012-10-27 ENCOUNTER — Other Ambulatory Visit (INDEPENDENT_AMBULATORY_CARE_PROVIDER_SITE_OTHER): Payer: Self-pay | Admitting: General Surgery

## 2012-10-27 DIAGNOSIS — L0390 Cellulitis, unspecified: Secondary | ICD-10-CM

## 2012-10-27 MED ORDER — AMOXICILLIN-POT CLAVULANATE 875-125 MG PO TABS: 1.0000 | ORAL_TABLET | Freq: Two times a day (BID) | ORAL | Status: AC

## 2012-10-27 NOTE — Telephone Encounter (Signed)
I called the pt to let him know Dr Maisie Fus saw some inflammation on his ct and wants to put him on abx to be safe.  She sent the order to [facility].  We will see him tomorrow in the office.

## 2012-10-28 ENCOUNTER — Telehealth: Payer: Self-pay | Admitting: *Deleted

## 2012-10-28 ENCOUNTER — Ambulatory Visit (INDEPENDENT_AMBULATORY_CARE_PROVIDER_SITE_OTHER): Payer: BC Managed Care – PPO | Admitting: General Surgery

## 2012-10-28 ENCOUNTER — Encounter (INDEPENDENT_AMBULATORY_CARE_PROVIDER_SITE_OTHER): Payer: Self-pay | Admitting: General Surgery

## 2012-10-28 ENCOUNTER — Encounter (INDEPENDENT_AMBULATORY_CARE_PROVIDER_SITE_OTHER): Payer: Self-pay

## 2012-10-28 VITALS — BP 120/70 | HR 78 | Temp 98.0°F | Resp 12 | Wt 205.4 lb

## 2012-10-28 DIAGNOSIS — C189 Malignant neoplasm of colon, unspecified: Secondary | ICD-10-CM

## 2012-10-28 NOTE — Progress Notes (Signed)
Tony Paul is a 52 y.o. male who is status post a LAR on 12/19.  He has done well except for some left groin pain that is worse with movement.  He describes this as burning in nature.  He denies fevers, wound drainage or erythema.  Objective: Filed Vitals:   10/28/12 0954  BP: 120/70  Pulse: 78  Temp: 98 F (36.7 C)  Resp: 12    General appearance: alert and cooperative GI: soft, non-tender; bowel sounds normal; no masses,  no organomegaly  Incision: healing well   Assessment: s/p  Patient Active Problem List  Diagnosis  . HYPERLIPIDEMIA  . ROSACEA  . Annual physical exam  . Colon cancer    Plan: Doing well from GI standpoint but having some incisional pain that radiates to his L groin.  CT shows some inflammation in the area.  Will continue course of antibiotics and NSAIDs.  No signs of wound infection on exam.  Return to office in 1-2 weeks for re-evaluation.  Will refer to oncology for discussion of chemotherapy for his cancer.    Vanita Panda, MD [facility] Surgery, [place] [phone]   10/28/2012 10:18 AM

## 2012-10-28 NOTE — Telephone Encounter (Signed)
Received referral from Dr. Maisie Fus.  Left voice message with patient to return call.

## 2012-10-28 NOTE — Telephone Encounter (Signed)
Spoke with patient by phone and confirmed appointment with Dr. Truett Perna for 11/13/12.  Contact names and phone numbers were given.  Patient requested an earlier appointment date.  This RN will notify patient if one opens up.  Patient verbalized understanding.

## 2012-10-28 NOTE — Patient Instructions (Signed)
Continue antibiotics and Aleve.  We will see you back 1-2 weeks to re-evaluate.

## 2012-10-29 ENCOUNTER — Other Ambulatory Visit (HOSPITAL_COMMUNITY)
Admission: RE | Admit: 2012-10-29 | Discharge: 2012-10-29 | Disposition: A | Discharge status: Home / Self Care | Payer: BC Managed Care – PPO | Source: Ambulatory Visit | Attending: Oncology | Admitting: Oncology

## 2012-10-29 ENCOUNTER — Telehealth (INDEPENDENT_AMBULATORY_CARE_PROVIDER_SITE_OTHER): Payer: Self-pay

## 2012-10-29 DIAGNOSIS — C19 Malignant neoplasm of rectosigmoid junction: Secondary | ICD-10-CM | POA: Insufficient documentation

## 2012-10-29 NOTE — Telephone Encounter (Signed)
LATE ENTRY:  I returned the patient's call from yesterday.  His wife is encouraging him to go back to work.  He was planning to be out a couple more weeks.  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  He said since he is having trouble/ pain when he gets up and down that he wants to wait and see how he does through the weekend.  We will talk on Monday.

## 2012-10-30 ENCOUNTER — Telehealth: Payer: Self-pay | Admitting: Oncology

## 2012-10-30 NOTE — Telephone Encounter (Signed)
Pt schedule w/Dr. Truett Perna 018/23 @ 1:30.  Welcome packet mailed.

## 2012-11-03 ENCOUNTER — Encounter (INDEPENDENT_AMBULATORY_CARE_PROVIDER_SITE_OTHER): Payer: BC Managed Care – PPO | Admitting: General Surgery

## 2012-11-11 ENCOUNTER — Encounter (INDEPENDENT_AMBULATORY_CARE_PROVIDER_SITE_OTHER): Payer: Self-pay | Admitting: General Surgery

## 2012-11-11 ENCOUNTER — Ambulatory Visit (INDEPENDENT_AMBULATORY_CARE_PROVIDER_SITE_OTHER): Payer: BC Managed Care – PPO | Admitting: General Surgery

## 2012-11-11 ENCOUNTER — Encounter (INDEPENDENT_AMBULATORY_CARE_PROVIDER_SITE_OTHER): Payer: Self-pay

## 2012-11-11 VITALS — BP 118/88 | HR 72 | Temp 98.2°F | Resp 18 | Wt 205.5 lb

## 2012-11-11 DIAGNOSIS — C189 Malignant neoplasm of colon, unspecified: Secondary | ICD-10-CM

## 2012-11-11 NOTE — Progress Notes (Signed)
Tony Paul is a 52 y.o. male who is status post a LAR on 12/19.  He is doing better.  His L inguinal pain has mostly resolved.  He is having bowel movements and tolerating his diet.  He is scheduled to see Dr Truett Perna this week.  He is having some mild incisional discomfort.    Objective: Filed Vitals:   11/11/12 1223  BP: 118/88  Pulse: 72  Temp: 98.2 F (36.8 C)  Resp: 18    General appearance: alert and cooperative GI: soft, non-tender; bowel sounds normal; no masses,  no organomegaly  Incision: healing well   Assessment: s/p  Patient Active Problem List  Diagnosis  . HYPERLIPIDEMIA  . ROSACEA  . Annual physical exam  . Colon cancer    Plan: Tony Paul seems to be doing well.  He will discuss any further treatment with Dr Truett Perna this week.  I have released him back to work.  I will see him back in 3 months.    Vanita Panda, MD [facility] Surgery, [place] [phone]   11/11/2012 1:51 PM

## 2012-11-11 NOTE — Patient Instructions (Signed)
Follow up with Dr Truett Perna for a discussion about additional therapy.  Return to my office in 3 months for recheck.

## 2012-11-13 ENCOUNTER — Ambulatory Visit: Payer: BC Managed Care – PPO

## 2012-11-13 ENCOUNTER — Encounter: Payer: Self-pay | Admitting: Oncology

## 2012-11-13 ENCOUNTER — Telehealth: Payer: Self-pay | Admitting: Oncology

## 2012-11-13 ENCOUNTER — Ambulatory Visit (HOSPITAL_BASED_OUTPATIENT_CLINIC_OR_DEPARTMENT_OTHER): Payer: BC Managed Care – PPO | Admitting: Oncology

## 2012-11-13 ENCOUNTER — Other Ambulatory Visit: Payer: Self-pay | Admitting: *Deleted

## 2012-11-13 VITALS — BP 129/75 | HR 83 | Temp 97.8°F | Resp 20 | Ht 70.0 in | Wt 209.1 lb

## 2012-11-13 DIAGNOSIS — C189 Malignant neoplasm of colon, unspecified: Secondary | ICD-10-CM

## 2012-11-13 DIAGNOSIS — K7689 Other specified diseases of liver: Secondary | ICD-10-CM

## 2012-11-13 DIAGNOSIS — L719 Rosacea, unspecified: Secondary | ICD-10-CM

## 2012-11-13 DIAGNOSIS — C187 Malignant neoplasm of sigmoid colon: Secondary | ICD-10-CM

## 2012-11-13 NOTE — Progress Notes (Signed)
Checked in new pt with no financial concerns. °

## 2012-11-13 NOTE — Progress Notes (Signed)
Met with patient and wife.  Explained role of Gi navigator.  Education given to patient on colorectal cancer along with resource sheet for services provided at [facility].  Reading material provided to patient on Xeloda per request of Dr. Truett Perna.  He is considering this treatment and will make a decision by next week.  Referral made to dietician for diet education.  Patient denies need for SW services.  Contact names and phone numbers were provided to patient.  He verbalized understanding and was without questions.

## 2012-11-13 NOTE — Telephone Encounter (Signed)
GAve pt appt for lab and MD for end of January 2014, pt also will see nutritionist and he wants to enroll in chemo class

## 2012-11-14 ENCOUNTER — Encounter: Payer: BC Managed Care – PPO | Admitting: Nutrition

## 2012-11-14 NOTE — Progress Notes (Signed)
[facility] New Patient Consult   Referring MD: Theotis Gerdeman 52 y.o.  [birth year]    Reason for Referral: Colon cancer     HPI: He underwent a screening colonoscopy by Dr. Rhea Belton on 09/01/2012. 4 sessile polyps were found in the descending colon, sigmoid colon, and rectum. These were resected. A firm mass measuring 3 x 2 cm was found in the sigmoid colon. Multiple biopsies were obtained. The pathology revealed a tubular adenoma and hyperplastic polyp in the descending, sigmoid, and rectal polyps. The sigmoid colon mass biopsy revealed superficial fragments of adenomatous glandular dysplasia with high-grade dysplasia. He was referred to Dr. Maisie Fus and underwent a laparoscopic low anterior resection on 10/09/2012. There was no sign of carcinomatosis or liver metastases. The pathology [id number]) revealed a 2.5 cm adenocarcinoma of the sigmoid colon. Tumor invaded into and focally through the muscular propria into the pericolonic connective tissue (T3). The tumor was moderately differentiated. Lymphovascular and perineural invasion were not identified. The resection margins were negative. 12 lymph nodes were negative for metastatic carcinoma. The tumor returned microsatellite stable and there was no loss of mismatch repair proteins by immunohistochemical staining.  He continues to have soreness in the left groin area following surgery.    Past Medical History  Diagnosis Date  . HLD (hyperlipidemia)   . Rosacea   .    Marland Kitchen Dyspnea     on exertion  . 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 Heart murmur     Past Surgical History  Procedure Date  . Tonsillectomy 1971  . Colonoscopy  November 2013   . Laparoscopic low anterior resection 10/09/2012    Procedure: LAPAROSCOPIC LOW ANTERIOR RESECTION;  Surgeon: Romie Levee, MD;  Location: [facility];  Service: General;  Laterality: N/A;    Family History  Problem Relation Age of Onset  .  Heart failure Father   . Hypertension Other   . Hyperlipidemia Other   . Kidney disease Other   . Colon cancer Neg Hx   . Prostate cancer Neg Hx   One brother, 3 sons, no family history of cancer  Current outpatient prescriptions:aspirin EC 81 MG tablet, Take 81 mg by mouth daily as needed. On occasions, Disp: , Rfl: ;  atorvastatin (LIPITOR) 20 MG tablet, Take 20 mg by mouth daily., Disp: , Rfl: ;  fish oil-omega-3 fatty acids 1000 MG capsule, Take 3-4 g by mouth daily as needed. 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 He does not use tobacco, he drinks alcohol rarely, no transfusion history. No risk factor for HIV or hepatitis.   ROS:   Positives include: Dyspnea in cold weather-? Asthma, occasional diarrhea, one episode of rectal bleeding a few years ago, rosacea, occasional night sweats  A complete ROS was otherwise negative.  Physical Exam:  Blood pressure 129/75, pulse 83, temperature 97.8 F (36.6 C), temperature source Oral, resp. rate 20, height 5\' 10"  (1.778 m), weight 209 lb 1.6 oz (94.847 kg).  HEENT: Oropharynx without visible mass, neck without mass Lungs: Clear bilaterally Cardiac: Regular rate and rhythm Abdomen: No hepatomegaly, no mass, healed surgical incisions GU: Testes without mass  Vascular: No leg edema Lymph nodes: No cervical, supraclavicular, axillary, or inguinal nodes Neurologic: Alert and oriented, the motor exam appears grossly intact in the upper and lower extremities Skin: No rash Musculoskeletal: No spine tenderness  LAB:  CBC  Lab Results  Component Value Date   WBC 8.2 10/11/2012   HGB 12.4* 10/11/2012   HCT 36.5* 10/11/2012   MCV 86.5 10/11/2012   PLT 174 10/11/2012     CMP      Component Value Date/Time   NA 138 10/11/2012 0444   K 3.8 10/11/2012 0444   CL 103 10/11/2012 0444   CO2 28 10/11/2012 0444   GLUCOSE 111* 10/11/2012 0444   BUN 5*  10/11/2012 0444   CREATININE 0.90 10/11/2012 0444   CREATININE 1.01 07/17/2012 0902   CALCIUM 8.9 10/11/2012 0444   PROT 6.8 07/17/2012 0902   ALBUMIN 4.8 07/17/2012 0902   AST 16 07/17/2012 0902   ALT 13 07/17/2012 0902   ALKPHOS 54 07/17/2012 0902   BILITOT 1.1 07/17/2012 0902   GFRNONAA >90 10/11/2012 0444   GFRAA >90 10/11/2012 0444   CEA on 10/03/2012-1.1  Radiology: CTs of the chest, abdomen, and pelvis on 10/24/2012-no evidence for metastatic disease to the chest, 6 mm hypoattenuating lesion in the subcapsular right liver is too small to characterize. The liver is otherwise normal. No lymphadenopathy, no evidence for metastatic disease in the abdomen or pelvis.   Assessment/Plan:   1. Adenocarcinoma of the sigmoid colon, stage II (T3 N0), status post a low anterior resection 10/09/2012, microsatellite stable with no loss of mismatch repair protein expression  2. 6 mm indeterminate liver lesion on a CT of the abdomen 10/24/2012  3. Rosacea   Disposition:   He has been diagnosed with stage II colon cancer. I discussed the diagnosis, prognosis, and adjuvant treatment options with Mr. Lieske and his wife. We reviewed the details of the surgical pathology report. He has a good prognosis for a long-term disease-free survival. We discussed the controversy surrounding the use of adjuvant chemotherapy in patients with stage II colon cancer. His tumor does not have high-risk features such as lymphovascular invasion, bowel perforation, a markedly elevated CEA, or a limited number of sampled lymph nodes.  There is not a strong indication for adjuvant chemotherapy. We discussed the small predicted absolute benefit for 5-fluorouracil based chemotherapy in his case. We specifically discussed capecitabine chemotherapy. I reviewed the potential toxicities associated with capecitabine including the chance for mucositis, diarrhea, nausea, hematologic toxicity, skin hyperpigmentation, skin rash, and  the hand/foot syndrome. He was given reading materials on capecitabine.  He would like to consider the chemotherapy option. He will return for an office visit and further discussion within the next one week. We discussed the recommendation for a high fiber/low fat diet. He should undergo a one-year surveillance colonoscopy. I will discuss the indication for a repeat CT of the liver with Dr. Maisie Fus.  Phala Schraeder 11/14/2012, 8:20 AM

## 2012-11-17 ENCOUNTER — Other Ambulatory Visit: Payer: Self-pay | Admitting: *Deleted

## 2012-11-17 ENCOUNTER — Telehealth: Payer: Self-pay | Admitting: *Deleted

## 2012-11-17 DIAGNOSIS — C189 Malignant neoplasm of colon, unspecified: Secondary | ICD-10-CM

## 2012-11-17 NOTE — Telephone Encounter (Signed)
Patient called this RN and stated he wanted to cancel the 11/19/12 chemo class and MD visit.  He stated he had decided not to take chemo (Xeloda).  This RN informed Dr. Truett Perna, who stated he was okay with the decision and that he felt there was not a strong indication for adjuvant chemotherapy.  He stated the patient would need to return in 6 months for a CEA and MD visit.  This RN notified the patient that he would be receiving a call for the 6 month CEA and MD visit appointments from a scheduler.  This RN placed orders for CEA and MD visit in 6 months and sent to the scheduler.

## 2012-11-18 ENCOUNTER — Telehealth: Payer: Self-pay | Admitting: Oncology

## 2012-11-18 NOTE — Telephone Encounter (Signed)
Talked to patient gave  Him appt for July 21st lab and md

## 2012-11-19 ENCOUNTER — Ambulatory Visit: Payer: BC Managed Care – PPO | Admitting: Oncology

## 2012-11-19 ENCOUNTER — Other Ambulatory Visit: Payer: BC Managed Care – PPO

## 2012-11-25 ENCOUNTER — Telehealth (INDEPENDENT_AMBULATORY_CARE_PROVIDER_SITE_OTHER): Payer: Self-pay | Admitting: General Surgery

## 2012-11-25 NOTE — Telephone Encounter (Signed)
Patient called in today and stated that he is feeling sore. I asked him that he become more active and he stated yes and I told him more and more he becomes active he will be sore. He asked if he could take Aleve and I told him yes. I asked if he is having BM and he stated yes. He just wanted to check in

## 2012-12-30 ENCOUNTER — Ambulatory Visit (INDEPENDENT_AMBULATORY_CARE_PROVIDER_SITE_OTHER): Payer: BC Managed Care – PPO | Admitting: Family Medicine

## 2012-12-30 ENCOUNTER — Encounter: Payer: Self-pay | Admitting: Family Medicine

## 2012-12-30 VITALS — BP 120/82 | HR 85 | Temp 97.9°F | Ht 70.0 in | Wt 197.1 lb

## 2012-12-30 DIAGNOSIS — R197 Diarrhea, unspecified: Secondary | ICD-10-CM

## 2012-12-30 DIAGNOSIS — R109 Unspecified abdominal pain: Secondary | ICD-10-CM

## 2012-12-30 DIAGNOSIS — F341 Dysthymic disorder: Secondary | ICD-10-CM

## 2012-12-30 DIAGNOSIS — C189 Malignant neoplasm of colon, unspecified: Secondary | ICD-10-CM

## 2012-12-30 DIAGNOSIS — F32A Depression, unspecified: Secondary | ICD-10-CM

## 2012-12-30 DIAGNOSIS — E785 Hyperlipidemia, unspecified: Secondary | ICD-10-CM

## 2012-12-30 DIAGNOSIS — F419 Anxiety disorder, unspecified: Secondary | ICD-10-CM

## 2012-12-30 DIAGNOSIS — F418 Other specified anxiety disorders: Secondary | ICD-10-CM

## 2012-12-30 LAB — HEPATIC FUNCTION PANEL
ALT: 11 U/L (ref 0–53)
Albumin: 5 g/dL (ref 3.5–5.2)
Indirect Bilirubin: 0.8 mg/dL (ref 0.0–0.9)
Total Protein: 6.6 g/dL (ref 6.0–8.3)

## 2012-12-30 LAB — CBC
HCT: 43.2 % (ref 39.0–52.0)
MCH: 30.2 pg (ref 26.0–34.0)
MCHC: 34.7 g/dL (ref 30.0–36.0)
MCV: 86.9 fL (ref 78.0–100.0)
RDW: 14.1 % (ref 11.5–15.5)

## 2012-12-30 LAB — RENAL FUNCTION PANEL
Albumin: 5 g/dL (ref 3.5–5.2)
BUN: 12 mg/dL (ref 6–23)
CO2: 30 mEq/L (ref 19–32)
Chloride: 106 mEq/L (ref 96–112)
Creat: 0.92 mg/dL (ref 0.50–1.35)

## 2012-12-30 LAB — LIPID PANEL
Cholesterol: 149 mg/dL (ref 0–200)
VLDL: 23 mg/dL (ref 0–40)

## 2012-12-30 MED ORDER — ESCITALOPRAM OXALATE 10 MG PO TABS: 10.0000 mg | ORAL_TABLET | Freq: Every day | ORAL | Status: DC

## 2012-12-30 MED ORDER — HYOSCYAMINE SULFATE 0.125 MG SL SUBL: 0.1250 mg | SUBLINGUAL_TABLET | SUBLINGUAL | Status: DC | PRN

## 2012-12-30 NOTE — Patient Instructions (Addendum)
MegaRed krill oil by Schiff one daily Digestive Advantage probiotic gummy daily  Fiber such as Metamucil, Benefiber, Flax or Chia  No Partially Hydrogenated/Trans fats in foods  Cholesterol Cholesterol is a white, waxy, fat-like protein needed by your body in small amounts. The liver makes all the cholesterol you need. It is carried from the liver by the blood through the blood vessels. Deposits (plaque) may build up on blood vessel walls. This makes the arteries narrower and stiffer. Plaque increases the risk for heart attack and stroke. You cannot feel your cholesterol level even if it is very high. The only way to know is by a blood test to check your lipid (fats) levels. Once you know your cholesterol levels, you should keep a record of the test results. Work with your caregiver to to keep your levels in the desired range. WHAT THE RESULTS MEAN:  Total cholesterol is a rough measure of all the cholesterol in your blood.  LDL is the so-called bad cholesterol. This is the type that deposits cholesterol in the walls of the arteries. You want this level to be low.  HDL is the good cholesterol because it cleans the arteries and carries the LDL away. You want this level to be high.  Triglycerides are fat that the body can either burn for energy or store. High levels are closely linked to heart disease. DESIRED LEVELS:  Total cholesterol below 200.  LDL below 100 for people at risk, below 70 for very high risk.  HDL above 50 is good, above 60 is best.  Triglycerides below 150. HOW TO LOWER YOUR CHOLESTEROL:  Diet.  Choose fish or white meat chicken and [place], roasted or baked. Limit fatty cuts of red meat, fried foods, and processed meats, such as sausage and lunch meat.  Eat lots of fresh fruits and vegetables. Choose whole grains, beans, pasta, potatoes and cereals.  Use only small amounts of olive, corn or canola oils. Avoid butter, mayonnaise, shortening or palm kernel oils.  Avoid foods with trans-fats.  Use skim/nonfat milk and low-fat/nonfat yogurt and cheeses. Avoid whole milk, cream, ice cream, egg yolks and cheeses. Healthy desserts include angel food cake, ginger snaps, animal crackers, hard candy, popsicles, and low-fat/nonfat frozen yogurt. Avoid pastries, cakes, pies and cookies.  Exercise.  A regular program helps decrease LDL and raises HDL.  Helps with weight control.  Do things that increase your activity level like gardening, walking, or taking the stairs.  Medication.  May be prescribed by your caregiver to help lowering cholesterol and the risk for heart disease.  You may need medicine even if your levels are normal if you have several risk factors. HOME CARE INSTRUCTIONS   Follow your diet and exercise programs as suggested by your caregiver.  Take medications as directed.  Have blood work done when your caregiver feels it is necessary. MAKE SURE YOU:   Understand these instructions.  Will watch your condition.  Will get help right away if you are not doing well or get worse. Document Released: 07/03/2001 Document Revised: 12/31/2011 Document Reviewed: 12/24/2007 [facility] Patient Information 2013 [place], [place].

## 2013-01-04 ENCOUNTER — Encounter: Payer: Self-pay | Admitting: Family Medicine

## 2013-01-04 DIAGNOSIS — F418 Other specified anxiety disorders: Secondary | ICD-10-CM

## 2013-01-04 HISTORY — DX: Other specified anxiety disorders: F41.8

## 2013-01-04 NOTE — Progress Notes (Signed)
Patient ID: Tony Paul, male   DOB: Jul 17, 1961, 52 y.o.   MRN: [id number] Tony Paul [id number] July 29, 1961 01/04/2013      Progress Note-Follow Up  Subjective  Chief Complaint  Chief Complaint  Patient presents with  . Follow-up    6 month    HPI  Patient is a 52 year old male in today for followup. He has had a rough period of time here with a diagnosis of colon cancer. He went in for screen colonoscopy and it was discovered that he had colon cancer. Over the holidays he had to undergo surgery and was diagnosed with stage II cancer. He did not have to undergo chemoradiation overall feels he is doing well. Has several loose bowel movements a day but denies any bloody or tarry stool. No anorexia, nausea vomiting fevers. Has a great deal of stress at work and acknowledges that he is beginning to feel overwhelmed by all of the stressors. Denies suicidal ideation. Notes irritability and difficulty concentrating or daily problem. He works as an Art gallery manager. No chest pain or palpitations. No shortness of breath or urinary complaints noted  Past Medical History  Diagnosis Date  . HLD (hyperlipidemia)   . Rosacea   . Health maintenance examination   . Dyspnea     on exertion  . 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 Heart murmur     Past Surgical History  Procedure Laterality Date  . Tonsillectomy  1971  . Colonoscopy    . Laparoscopic low anterior resection  10/09/2012    Procedure: LAPAROSCOPIC LOW ANTERIOR RESECTION;  Surgeon: Romie Levee, MD;  Location: [facility];  Service: General;  Laterality: N/A;    Family History  Problem Relation Age of Onset  . Heart failure Father   . Hypertension Other   . Hyperlipidemia Other   . Kidney disease Other   . Colon cancer Neg Hx   . Prostate cancer Neg Hx     History   Social History  . Marital Status: Married    Spouse Name: N/A    Number of Children: N/A  . Years of Education: N/A   Occupational History   . Not on file.   Social History Main Topics  . Smoking status: Never Smoker   . Smokeless tobacco: Never Used  . Alcohol Use: 0.6 oz/week    1 Glasses of wine per week     Comment: some weeks none  . Drug Use: No  . Sexually Active: Not on file   Other Topics Concern  . Not on file   Social History Narrative   Nurse, adult for [employer]   Married 23 years   3 sons 21, 19,14    Current Outpatient Prescriptions on File Prior to Visit  Medication Sig Dispense Refill  . aspirin EC 81 MG tablet Take 81 mg by mouth daily as needed. On occasions      . atorvastatin (LIPITOR) 20 MG tablet Take 20 mg by mouth daily.      . fish oil-omega-3 fatty acids 1000 MG capsule Take 3-4 g by mouth daily as needed. On occasion       No current facility-administered medications on file prior to visit.    No Known Allergies  Review of Systems  Review of Systems  Constitutional: Negative for fever and malaise/fatigue.  HENT: Negative for congestion.   Eyes: Negative for discharge.  Respiratory: Negative for shortness of breath.   Cardiovascular: Negative for chest pain, palpitations  and leg swelling.  Gastrointestinal: Negative for nausea, abdominal pain and diarrhea.  Genitourinary: Negative for dysuria.  Musculoskeletal: Negative for falls.  Skin: Negative for rash.  Neurological: Negative for loss of consciousness and headaches.  Endo/Heme/Allergies: Negative for polydipsia.  Psychiatric/Behavioral: Negative for depression and suicidal ideas. The patient is not nervous/anxious and does not have insomnia.     Objective  BP 120/82  Pulse 85  Temp(Src) 97.9 F (36.6 C) (Oral)  Ht 5\' 10"  (1.778 m)  Wt 197 lb 1.3 oz (89.395 kg)  BMI 28.28 kg/m2  SpO2 96%  Physical Exam  Physical Exam  Constitutional: He is oriented to person, place, and time and well-developed, well-nourished, and in no distress. No distress.  HENT:  Head: Normocephalic and atraumatic.  Eyes:  Conjunctivae are normal.  Neck: Neck supple. No thyromegaly present.  Cardiovascular: Normal rate, regular rhythm and normal heart sounds.   No murmur heard. Pulmonary/Chest: Effort normal and breath sounds normal. No respiratory distress.  Abdominal: He exhibits no distension and no mass. There is no tenderness.  Musculoskeletal: He exhibits no edema.  Neurological: He is alert and oriented to person, place, and time.  Skin: Skin is warm.  Psychiatric: Memory, affect and judgment normal.    Lab Results  Component Value Date   TSH 0.829 12/30/2012   Lab Results  Component Value Date   WBC 7.0 12/30/2012   HGB 15.0 12/30/2012   HCT 43.2 12/30/2012   MCV 86.9 12/30/2012   PLT 224 12/30/2012   Lab Results  Component Value Date   CREATININE 0.92 12/30/2012   BUN 12 12/30/2012   NA 142 12/30/2012   K 4.7 12/30/2012   CL 106 12/30/2012   CO2 30 12/30/2012   Lab Results  Component Value Date   ALT 11 12/30/2012   AST 15 12/30/2012   ALKPHOS 57 12/30/2012   BILITOT 1.0 12/30/2012   Lab Results  Component Value Date   CHOL 149 12/30/2012   Lab Results  Component Value Date   HDL 40 12/30/2012   Lab Results  Component Value Date   LDLCALC 86 12/30/2012   Lab Results  Component Value Date   TRIG 114 12/30/2012   Lab Results  Component Value Date   CHOLHDL 3.7 12/30/2012     Assessment & Plan  Colon cancer Discovered on routine colonoscopy, bowels move a couple times a day s/p diagnosis, no bloody or tarry stool  HYPERLIPIDEMIA Well controlled, Atorvastatin 20 mg qhs  Depression with anxiety Started on Lexapro and given a small amount

## 2013-01-04 NOTE — Assessment & Plan Note (Signed)
Well controlled, Atorvastatin 20 mg qhs

## 2013-01-04 NOTE — Assessment & Plan Note (Signed)
Discovered on routine colonoscopy, bowels move a couple times a day s/p diagnosis, no bloody or tarry stool

## 2013-01-04 NOTE — Assessment & Plan Note (Addendum)
Started on Lexapro and given a small amount

## 2013-02-10 ENCOUNTER — Encounter (INDEPENDENT_AMBULATORY_CARE_PROVIDER_SITE_OTHER): Payer: Self-pay | Admitting: General Surgery

## 2013-02-10 ENCOUNTER — Ambulatory Visit (INDEPENDENT_AMBULATORY_CARE_PROVIDER_SITE_OTHER): Payer: BC Managed Care – PPO | Admitting: General Surgery

## 2013-02-10 VITALS — BP 100/62 | HR 66 | Temp 97.9°F | Resp 18 | Wt 193.4 lb

## 2013-02-10 DIAGNOSIS — C189 Malignant neoplasm of colon, unspecified: Secondary | ICD-10-CM

## 2013-02-10 NOTE — Progress Notes (Signed)
Tony Paul is a 52 y.o. male who is here for a follow up visit regarding his rectal cancer.  He is about 4 months s/p LAR.  He denies any blood in his stools.  His BM's are still a little loose but are getting better.  He is trying to increase his fiber intake.  He denies any unintentional weight loss.  He is having increased fatigue recently due to some recent insomnia.   Objective: Filed Vitals:   02/10/13 1224  BP: 100/62  Pulse: 66  Temp: 97.9 F (36.6 C)  Resp: 18    General appearance: alert and cooperative Resp: clear to auscultation bilaterally Cardio: regular rate and rhythm GI: soft, non-tender; bowel sounds normal; no masses,  no organomegaly Inc: healing well, no hernias palpated  CTs of the chest, abdomen, and pelvis on 10/24/2012-no evidence for metastatic disease to the chest, 6 mm hypoattenuating lesion in the subcapsular right liver is too small to characterize. The liver is otherwise normal. No lymphadenopathy, no evidence for metastatic disease in the abdomen or pelvis. Lab Results  Component Value Date   CEA 1.1 10/03/2012    Assessment and Plan: Tony Paul is a 52 y.o. M who is s/p LAR for a T3N0 distal sigmoid tumor.  He is doing well.  I agree with increasing his fiber intake.  He will see Dr Truett Perna back in July for exam and repeat CEA level.  I will talk to Dr Truett Perna about possibly repeating his CT again at that time to check the liver lesion.  He will need a colonoscopy and CT scans in Dec for surveillance.     Vanita Panda, MD [facility] Surgery, [place] [phone]

## 2013-02-10 NOTE — Patient Instructions (Signed)
You will see Dr Truett Perna in July.  I will see you back in Oct.

## 2013-03-05 ENCOUNTER — Ambulatory Visit: Payer: BC Managed Care – PPO | Admitting: Family Medicine

## 2013-04-06 ENCOUNTER — Ambulatory Visit (INDEPENDENT_AMBULATORY_CARE_PROVIDER_SITE_OTHER): Payer: BC Managed Care – PPO | Admitting: Family Medicine

## 2013-04-06 ENCOUNTER — Encounter: Payer: Self-pay | Admitting: Family Medicine

## 2013-04-06 VITALS — BP 112/80 | HR 72 | Temp 97.8°F | Ht 70.0 in | Wt 191.1 lb

## 2013-04-06 DIAGNOSIS — G479 Sleep disorder, unspecified: Secondary | ICD-10-CM

## 2013-04-06 DIAGNOSIS — R351 Nocturia: Secondary | ICD-10-CM

## 2013-04-06 MED ORDER — ALPRAZOLAM 0.25 MG PO TABS: 0.2500 mg | ORAL_TABLET | Freq: Two times a day (BID) | ORAL | Status: DC | PRN

## 2013-04-06 MED ORDER — SERTRALINE HCL 50 MG PO TABS: 50.0000 mg | ORAL_TABLET | Freq: Every day | ORAL | Status: DC

## 2013-04-06 MED ORDER — TAMSULOSIN HCL 0.4 MG PO CAPS: 0.4000 mg | ORAL_CAPSULE | Freq: Every day | ORAL | Status: DC

## 2013-04-06 NOTE — Patient Instructions (Addendum)
DASH Diet The DASH diet stands for "Dietary Approaches to Stop Hypertension." It is a healthy eating plan that has been shown to reduce high blood pressure (hypertension) in as little as 14 days, while also possibly providing other significant health benefits. These other health benefits include reducing the risk of breast cancer after menopause and reducing the risk of type 2 diabetes, heart disease, colon cancer, and stroke. Health benefits also include weight loss and slowing kidney failure in patients with chronic kidney disease.  DIET GUIDELINES  Limit salt (sodium). Your diet should contain less than 1500 mg of sodium daily.  Limit refined or processed carbohydrates. Your diet should include mostly whole grains. Desserts and added sugars should be used sparingly.  Include small amounts of heart-healthy fats. These types of fats include nuts, oils, and tub margarine. Limit saturated and trans fats. These fats have been shown to be harmful in the body. CHOOSING FOODS  The following food groups are based on a 2000 calorie diet. See your Registered Dietitian for individual calorie needs. Grains and Grain Products (6 to 8 servings daily)  Eat More Often: Whole-wheat bread, brown rice, whole-grain or wheat pasta, quinoa, popcorn without added fat or salt (air popped).  Eat Less Often: White bread, white pasta, white rice, cornbread. Vegetables (4 to 5 servings daily)  Eat More Often: Fresh, frozen, and canned vegetables. Vegetables may be raw, steamed, roasted, or grilled with a minimal amount of fat.  Eat Less Often/Avoid: Creamed or fried vegetables. Vegetables in a cheese sauce. Fruit (4 to 5 servings daily)  Eat More Often: All fresh, canned (in natural juice), or frozen fruits. Dried fruits without added sugar. One hundred percent fruit juice ( cup [237 mL] daily).  Eat Less Often: Dried fruits with added sugar. Canned fruit in light or heavy syrup. [employer], Fish, and Poultry  (2 servings or less daily. One serving is 3 to 4 oz [85-114 g]).  Eat More Often: Ninety percent or leaner ground beef, tenderloin, sirloin. Round cuts of beef, chicken breast, [place] breast. All fish. Grill, bake, or broil your meat. Nothing should be fried.  Eat Less Often/Avoid: Fatty cuts of meat, [place], or chicken leg, thigh, or wing. Fried cuts of meat or fish. Dairy (2 to 3 servings)  Eat More Often: Low-fat or fat-free milk, low-fat plain or light yogurt, reduced-fat or part-skim cheese.  Eat Less Often/Avoid: Milk (whole, 2%).Whole milk yogurt. Full-fat cheeses. Nuts, Seeds, and Legumes (4 to 5 servings per week)  Eat More Often: All without added salt.  Eat Less Often/Avoid: Salted nuts and seeds, canned beans with added salt. Fats and Sweets (limited)  Eat More Often: Vegetable oils, tub margarines without trans fats, sugar-free gelatin. Mayonnaise and salad dressings.  Eat Less Often/Avoid: Coconut oils, palm oils, butter, stick margarine, cream, half and half, cookies, candy, pie. FOR MORE INFORMATION The Dash Diet Eating Plan: www.dashdiet.org Document Released: 09/27/2011 Document Revised: 12/31/2011 Document Reviewed: 09/27/2011 [facility] Patient Information 2014 [place], [place]. Sleep Apnea  Sleep apnea is a sleep disorder characterized by abnormal pauses in breathing while you sleep. When your breathing pauses, the level of oxygen in your blood decreases. This causes you to move out of deep sleep and into light sleep. As a result, your quality of sleep is poor, and the system that carries your blood throughout your body (cardiovascular system) experiences stress. If sleep apnea remains untreated, the following conditions can develop:  High blood pressure (hypertension).  Coronary artery disease.  Inability  to achieve or maintain an erection (impotence).  Impairment of your thought process (cognitive dysfunction). There are three types of sleep  apnea: 1. Obstructive sleep apnea Pauses in breathing during sleep because of a blocked airway. 2. Central sleep apnea Pauses in breathing during sleep because the area of the brain that controls your breathing does not send the correct signals to the muscles that control breathing. 3. Mixed sleep apnea A combination of both obstructive and central sleep apnea. RISK FACTORS The following risk factors can increase your risk of developing sleep apnea:  Being overweight.  Smoking.  Having narrow passages in your nose and throat.  Being of older age.  Being male.  Alcohol use.  Sedative and tranquilizer use.  Ethnicity. Among individuals younger than 35 years, African Americans are at increased risk of sleep apnea. SYMPTOMS   Difficulty staying asleep.  Daytime sleepiness and fatigue.  Loss of energy.  Irritability.  Loud, heavy snoring.  Morning headaches.  Trouble concentrating.  Forgetfulness.  Decreased interest in sex. DIAGNOSIS  In order to diagnose sleep apnea, your caregiver will perform a physical examination. Your caregiver may suggest that you take a home sleep test. Your caregiver may also recommend that you spend the night in a sleep lab. In the sleep lab, several monitors record information about your heart, lungs, and brain while you sleep. Your leg and arm movements and blood oxygen level are also recorded. TREATMENT The following actions may help to resolve mild sleep apnea:  Sleeping on your side.   Using a decongestant if you have nasal congestion.   Avoiding the use of depressants, including alcohol, sedatives, and narcotics.   Losing weight and modifying your diet if you are overweight. There also are devices and treatments to help open your airway:  Oral appliances. These are custom-made mouthpieces that shift your lower jaw forward and slightly open your bite. This opens your airway.  Devices that create positive airway pressure. This  positive pressure "splints" your airway open to help you breathe better during sleep. The following devices create positive airway pressure:  Continuous positive airway pressure (CPAP) device. The CPAP device creates a continuous level of air pressure with an air pump. The air is delivered to your airway through a mask while you sleep. This continuous pressure keeps your airway open.  Nasal expiratory positive airway pressure (EPAP) device. The EPAP device creates positive air pressure as you exhale. The device consists of single-use valves, which are inserted into each nostril and held in place by adhesive. The valves create very little resistance when you inhale but create much more resistance when you exhale. That increased resistance creates the positive airway pressure. This positive pressure while you exhale keeps your airway open, making it easier to breath when you inhale again.  Bilevel positive airway pressure (BPAP) device. The BPAP device is used mainly in patients with central sleep apnea. This device is similar to the CPAP device because it also uses an air pump to deliver continuous air pressure through a mask. However, with the BPAP machine, the pressure is set at two different levels. The pressure when you exhale is lower than the pressure when you inhale.  Surgery. Typically, surgery is only done if you cannot comply with less invasive treatments or if the less invasive treatments do not improve your condition. Surgery involves removing excess tissue in your airway to create a wider passage way. Document Released: 09/28/2002 Document Revised: 04/08/2012 Document Reviewed: 02/14/2012 [facility] Patient Information 2014 [employer].  

## 2013-04-08 ENCOUNTER — Encounter: Payer: Self-pay | Admitting: Family Medicine

## 2013-04-08 NOTE — Progress Notes (Signed)
Patient ID: Tony Paul, male   DOB: [birth year], 52 y.o.   MRN: [id number] Shiloh Swopes [id number] 1961-04-02 04/08/2013      Progress Note-Follow Up  Subjective  Chief Complaint  Chief Complaint  Patient presents with  . Follow-up    2 month    HPI  Patient is a 52 yo male in today for followup. He acknowledges his wife is putting him because he snores excessively. He falls he is struggling with urinary frequency as well. No recent illness. No fevers or chills. Her congestion, chest pain, palpitations, shortness of breath, GI concerns  Past Medical History  Diagnosis Date  . HLD (hyperlipidemia)   . Rosacea   . Health maintenance examination   . Dyspnea     on exertion  . 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 Heart murmur   . Depression with anxiety 01/04/2013    Past Surgical History  Procedure Laterality Date  . Tonsillectomy  1971  . Colonoscopy    . Laparoscopic low anterior resection  10/09/2012    Procedure: LAPAROSCOPIC LOW ANTERIOR RESECTION;  Surgeon: Romie Levee, MD;  Location: [facility];  Service: General;  Laterality: N/A;    Family History  Problem Relation Age of Onset  . Heart failure Father   . Hypertension Other   . Hyperlipidemia Other   . Kidney disease Other   . Colon cancer Neg Hx   . Prostate cancer Neg Hx     History   Social History  . Marital Status: Married    Spouse Name: N/A    Number of Children: N/A  . Years of Education: N/A   Occupational History  . Not on file.   Social History Main Topics  . Smoking status: Never Smoker   . Smokeless tobacco: Never Used  . Alcohol Use: 0.6 oz/week    1 Glasses of wine per week     Comment: some weeks none  . Drug Use: No  . Sexually Active: Not on file   Other Topics Concern  . 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 aspirin EC 81 MG tablet Take 81 mg by mouth daily as needed. On occasions      . atorvastatin (LIPITOR) 20 MG tablet Take 20 mg by mouth daily.      . fish oil-omega-3 fatty acids 1000 MG capsule Take 3-4 g by mouth daily as needed. On occasion      . hyoscyamine (LEVSIN SL) 0.125 MG SL tablet Place 1 tablet (0.125 mg total) under the tongue every 4 (four) hours as needed for cramping.  30 tablet  1  . metroNIDAZOLE (METROGEL) 0.75 % gel Apply 1 application topically at bedtime.      . Sulfacetamide Sodium-Sulfur 10-5 % CREA Apply topically.       No current facility-administered medications on file prior to visit.    No Known Allergies  Review of Systems  Review of Systems  Constitutional: Positive for malaise/fatigue. Negative for fever.  HENT: Positive for congestion.   Eyes: Negative for pain and discharge.  Respiratory: Negative for shortness of breath.   Cardiovascular: Negative for chest pain, palpitations and leg swelling.  Gastrointestinal: Negative for nausea, abdominal pain and diarrhea.  Genitourinary: Positive for frequency. Negative for  dysuria, urgency and hematuria.  Musculoskeletal: Negative for falls.  Skin: Negative for rash.  Neurological: Negative for loss of consciousness and headaches.  Endo/Heme/Allergies: Negative for polydipsia.  Psychiatric/Behavioral: Negative for depression and suicidal ideas. The patient is not nervous/anxious and does not have insomnia.     Objective  BP 112/80  Pulse 72  Temp(Src) 97.8 F (36.6 C) (Oral)  Ht 5\' 10"  (1.778 m)  Wt 191 lb 1.3 oz (86.673 kg)  BMI 27.42 kg/m2  SpO2 97%  Physical Exam  Physical Exam  Constitutional: He is oriented to person, place, and time and well-developed, well-nourished, and in no distress. No distress.  HENT:  Head: Normocephalic and atraumatic.  Eyes: Conjunctivae are normal.  Neck: Neck supple. No thyromegaly present.  Cardiovascular: Normal rate,  regular rhythm and normal heart sounds.   No murmur heard. Pulmonary/Chest: Effort normal and breath sounds normal. No respiratory distress.  Abdominal: He exhibits no distension and no mass. There is no tenderness.  Musculoskeletal: He exhibits no edema.  Neurological: He is alert and oriented to person, place, and time.  Skin: Skin is warm.  Psychiatric: Memory, affect and judgment normal.    Lab Results  Component Value Date   TSH 0.829 12/30/2012   Lab Results  Component Value Date   WBC 7.0 12/30/2012   HGB 15.0 12/30/2012   HCT 43.2 12/30/2012   MCV 86.9 12/30/2012   PLT 224 12/30/2012   Lab Results  Component Value Date   CREATININE 0.92 12/30/2012   BUN 12 12/30/2012   NA 142 12/30/2012   K 4.7 12/30/2012   CL 106 12/30/2012   CO2 30 12/30/2012   Lab Results  Component Value Date   ALT 11 12/30/2012   AST 15 12/30/2012   ALKPHOS 57 12/30/2012   BILITOT 1.0 12/30/2012   Lab Results  Component Value Date   CHOL 149 12/30/2012   Lab Results  Component Value Date   HDL 40 12/30/2012   Lab Results  Component Value Date   LDLCALC 86 12/30/2012   Lab Results  Component Value Date   TRIG 114 12/30/2012   Lab Results  Component Value Date   CHOLHDL 3.7 12/30/2012    Assessment & Plan  Nocturia Referred to urology for further consideration  Restless sleeper Agrees to sleep study

## 2013-04-09 ENCOUNTER — Encounter: Payer: Self-pay | Admitting: Family Medicine

## 2013-04-09 DIAGNOSIS — G479 Sleep disorder, unspecified: Secondary | ICD-10-CM

## 2013-04-09 DIAGNOSIS — R351 Nocturia: Secondary | ICD-10-CM

## 2013-04-09 HISTORY — DX: Sleep disorder, unspecified: G47.9

## 2013-04-09 HISTORY — DX: Nocturia: R35.1

## 2013-04-09 NOTE — Assessment & Plan Note (Addendum)
Referred to urology for further consideration, encouraged increased rest, have ordered a sleep study

## 2013-04-09 NOTE — Assessment & Plan Note (Signed)
Agrees to sleep study

## 2013-04-22 ENCOUNTER — Encounter: Payer: Self-pay | Admitting: Family Medicine

## 2013-05-08 ENCOUNTER — Other Ambulatory Visit: Payer: Self-pay | Admitting: *Deleted

## 2013-05-08 DIAGNOSIS — C189 Malignant neoplasm of colon, unspecified: Secondary | ICD-10-CM

## 2013-05-11 ENCOUNTER — Other Ambulatory Visit: Payer: BC Managed Care – PPO | Admitting: Lab

## 2013-05-11 ENCOUNTER — Ambulatory Visit (HOSPITAL_BASED_OUTPATIENT_CLINIC_OR_DEPARTMENT_OTHER): Payer: BC Managed Care – PPO | Admitting: Oncology

## 2013-05-11 VITALS — BP 105/58 | HR 72 | Temp 97.7°F | Resp 18 | Ht 70.0 in | Wt 192.5 lb

## 2013-05-11 DIAGNOSIS — L719 Rosacea, unspecified: Secondary | ICD-10-CM

## 2013-05-11 DIAGNOSIS — C189 Malignant neoplasm of colon, unspecified: Secondary | ICD-10-CM

## 2013-05-11 DIAGNOSIS — K7689 Other specified diseases of liver: Secondary | ICD-10-CM

## 2013-05-11 DIAGNOSIS — C187 Malignant neoplasm of sigmoid colon: Secondary | ICD-10-CM

## 2013-05-11 NOTE — Progress Notes (Signed)
   [facility]    OFFICE PROGRESS NOTE   INTERVAL HISTORY:   He returns as scheduled. He feels well in general. He has intermittent "cramping "in the low abdomen when leaning forward while sitting.  Objective:  Vital signs in last 24 hours:  Blood pressure 105/58, pulse 72, temperature 97.7 F (36.5 C), temperature source Oral, resp. rate 18, height 5\' 10"  (1.778 m), weight 192 lb 8 oz (87.317 kg), SpO2 99.00%.    HEENT: Neck without mass Lymphatics: No cervical, supraclavicular, or inguinal nodes. "Shotty "bilateral axillary nodes Resp: Lungs clear bilaterally Cardio: Regular rate and rhythm GI: No hepatosplenomegaly, no mass, nontender Vascular: No leg edema   Lab Results:  CEA pending   Medications: I have reviewed the patient's current medications.  Assessment/Plan: 1. Adenocarcinoma of the sigmoid colon, stage II (T3 N0), status post a low anterior resection 10/09/2012, microsatellite stable with no loss of mismatch repair protein expression  2. 6 mm indeterminate liver lesion on a CT of the abdomen 10/24/2012  3. Rosacea    Disposition:  He remains in clinical remission from colon cancer. We will refer him for a one-year surveillance colonoscopy. He will return for an office visit and CEA in 6 months.  We will schedule a repeat CT to follow up on the indeterminate liver lesion.     Thornton Papas, MD  05/11/2013  6:18 PM

## 2013-05-12 ENCOUNTER — Telehealth: Payer: Self-pay | Admitting: *Deleted

## 2013-05-12 ENCOUNTER — Other Ambulatory Visit: Payer: Self-pay | Admitting: Oncology

## 2013-05-12 DIAGNOSIS — C189 Malignant neoplasm of colon, unspecified: Secondary | ICD-10-CM

## 2013-05-12 LAB — BASIC METABOLIC PANEL
CO2: 28 mEq/L (ref 19–32)
Chloride: 105 mEq/L (ref 96–112)
Glucose, Bld: 98 mg/dL (ref 70–99)
Sodium: 140 mEq/L (ref 135–145)

## 2013-05-12 NOTE — Telephone Encounter (Signed)
Per Dr. Truett Perna, called and spoke with pt explaining that MD wanted to go ahead with CT to f/u liver lesion from 11/10/12 instead of taking case to GI conference as previously discussed at office visit 05/11/13.  Pt verbalized understanding and stated he would go ahead and get CT scheduled.

## 2013-05-12 NOTE — Telephone Encounter (Signed)
Lm gv appt d/t for 11/16/13 with labs @ 2:45 and ov @ 3:15pm. i also made the pt aware that cs has been trying to get in touch with him to schedule his CT. The pt is aware that i will mail a letter/avs as well...td

## 2013-05-13 LAB — CEA: CEA: 0.8 ng/mL (ref 0.0–5.0)

## 2013-05-26 ENCOUNTER — Encounter (HOSPITAL_COMMUNITY): Payer: Self-pay

## 2013-05-26 ENCOUNTER — Ambulatory Visit (HOSPITAL_COMMUNITY)
Admission: RE | Admit: 2013-05-26 | Discharge: 2013-05-26 | Disposition: A | Discharge status: Home / Self Care | Payer: BC Managed Care – PPO | Source: Ambulatory Visit | Attending: Oncology | Admitting: Oncology

## 2013-05-26 DIAGNOSIS — Z85038 Personal history of other malignant neoplasm of large intestine: Secondary | ICD-10-CM | POA: Insufficient documentation

## 2013-05-26 DIAGNOSIS — R1032 Left lower quadrant pain: Secondary | ICD-10-CM | POA: Insufficient documentation

## 2013-05-26 DIAGNOSIS — K7689 Other specified diseases of liver: Secondary | ICD-10-CM | POA: Insufficient documentation

## 2013-05-26 DIAGNOSIS — C189 Malignant neoplasm of colon, unspecified: Secondary | ICD-10-CM

## 2013-05-26 HISTORY — DX: Malignant (primary) neoplasm, unspecified: C80.1

## 2013-05-26 MED ORDER — IOHEXOL 300 MG/ML  SOLN
100.0000 mL | Freq: Once | INTRAMUSCULAR | Status: AC | PRN
  Administered 2013-05-26: 100 mL via INTRAVENOUS

## 2013-05-27 ENCOUNTER — Encounter: Payer: Self-pay | Admitting: Oncology

## 2013-06-29 ENCOUNTER — Encounter: Payer: Self-pay | Admitting: Internal Medicine

## 2013-06-30 DIAGNOSIS — Z23 Encounter for immunization: Secondary | ICD-10-CM

## 2013-07-01 ENCOUNTER — Encounter (INDEPENDENT_AMBULATORY_CARE_PROVIDER_SITE_OTHER): Payer: Self-pay | Admitting: General Surgery

## 2013-07-03 ENCOUNTER — Encounter: Payer: Self-pay | Admitting: Internal Medicine

## 2013-07-06 ENCOUNTER — Ambulatory Visit (INDEPENDENT_AMBULATORY_CARE_PROVIDER_SITE_OTHER): Payer: BC Managed Care – PPO | Admitting: Family Medicine

## 2013-07-06 ENCOUNTER — Encounter: Payer: Self-pay | Admitting: Internal Medicine

## 2013-07-06 ENCOUNTER — Encounter: Payer: Self-pay | Admitting: Family Medicine

## 2013-07-06 VITALS — BP 110/78 | HR 78 | Temp 98.2°F | Ht 70.0 in | Wt 196.1 lb

## 2013-07-06 DIAGNOSIS — Z23 Encounter for immunization: Secondary | ICD-10-CM

## 2013-07-06 DIAGNOSIS — C189 Malignant neoplasm of colon, unspecified: Secondary | ICD-10-CM

## 2013-07-06 DIAGNOSIS — N400 Enlarged prostate without lower urinary tract symptoms: Secondary | ICD-10-CM

## 2013-07-06 DIAGNOSIS — F341 Dysthymic disorder: Secondary | ICD-10-CM

## 2013-07-06 DIAGNOSIS — E785 Hyperlipidemia, unspecified: Secondary | ICD-10-CM

## 2013-07-06 DIAGNOSIS — F418 Other specified anxiety disorders: Secondary | ICD-10-CM

## 2013-07-06 DIAGNOSIS — R351 Nocturia: Secondary | ICD-10-CM

## 2013-07-06 MED ORDER — ATORVASTATIN CALCIUM 20 MG PO TABS: 20.0000 mg | ORAL_TABLET | Freq: Every day | ORAL | Status: DC

## 2013-07-06 MED ORDER — TAMSULOSIN HCL 0.4 MG PO CAPS: 0.4000 mg | ORAL_CAPSULE | Freq: Every day | ORAL | Status: DC

## 2013-07-06 NOTE — Assessment & Plan Note (Addendum)
Doing well is due for repeat visit to GI for surveillance. Encouraged probiotics and fiber supplement

## 2013-07-06 NOTE — Patient Instructions (Addendum)

## 2013-07-06 NOTE — Progress Notes (Signed)
Patient ID: Tony Paul, male   DOB: [birth year], 52 y.o.   MRN: [id number] Tony Paul [id number] January 03, 1961 07/06/2013      Progress Note-Follow Up  Subjective  Chief Complaint  Chief Complaint  Patient presents with  . Follow-up    3 month  . Injections    flu    HPI  Patient is a 52 year old Caucasian male in today for follow up doing well. He is recovering well from his partial colon resection and no sign of recurrence of colon cancer. He is following closely with GI and surgery. No bloody or tarry stool though he is having some mild constipation. No abdominal pain or back pain. No fevers or chills. No chest pain, palpitations, shortness of breath, GI or GU concerns noted. Is no longer T. medicines for anxiety depression and he feels he is doing well.  Past Medical History  Diagnosis Date  . HLD (hyperlipidemia)   . Rosacea   . Health maintenance examination   . Dyspnea     on exertion  . 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 Heart murmur   . Depression with anxiety 01/04/2013  . Nocturia 04/09/2013  . Restless sleeper 04/09/2013  . colon ca dx'd 08/2012    Past Surgical History  Procedure Laterality Date  . Tonsillectomy  1971  . Colonoscopy    . Laparoscopic low anterior resection  10/09/2012    Procedure: LAPAROSCOPIC LOW ANTERIOR RESECTION;  Surgeon: Romie Levee, MD;  Location: [facility];  Service: General;  Laterality: N/A;    Family History  Problem Relation Age of Onset  . Heart failure Father   . Hypertension Other   . Hyperlipidemia Other   . Kidney disease Other   . Colon cancer Neg Hx   . Prostate cancer Neg Hx     History   Social History  . Marital Status: Married    Spouse Name: N/A    Number of Children: N/A  . Years of Education: N/A   Occupational History  . Not on file.   Social History Main Topics  . Smoking status: Never Smoker   . Smokeless tobacco: Never Used  . Alcohol Use: 0.6 oz/week    1 Glasses of  wine per week     Comment: some weeks none  . Drug Use: No  . Sexual Activity: Not on file   Other Topics Concern  . 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 aspirin EC 81 MG tablet Take 81 mg by mouth daily as needed. On occasions      . fish oil-omega-3 fatty acids 1000 MG capsule Take 3-4 g by mouth daily as needed. On occasion      . metroNIDAZOLE (METROGEL) 0.75 % gel Apply 1 application topically at bedtime.      . Sulfacetamide Sodium-Sulfur 10-5 % CREA Apply topically.       No current facility-administered medications on file prior to visit.    No Known Allergies  Review of Systems  Review of Systems  Constitutional: Negative for fever and malaise/fatigue.  HENT: Negative for congestion.   Eyes: Negative for discharge.  Respiratory: Negative for shortness of breath.   Cardiovascular: Negative for chest pain, palpitations and leg swelling.  Gastrointestinal: Negative for  nausea, abdominal pain and diarrhea.  Genitourinary: Negative for dysuria.  Musculoskeletal: Negative for falls.  Skin: Negative for rash.  Neurological: Negative for loss of consciousness and headaches.  Endo/Heme/Allergies: Negative for polydipsia.  Psychiatric/Behavioral: Negative for depression and suicidal ideas. The patient is not nervous/anxious and does not have insomnia.     Objective  BP 110/78  Pulse 78  Temp(Src) 98.2 F (36.8 C) (Oral)  Ht 5\' 10"  (1.778 m)  Wt 196 lb 1.9 oz (88.959 kg)  BMI 28.14 kg/m2  SpO2 97%  Physical Exam  Physical Exam  Constitutional: He is oriented to person, place, and time and well-developed, well-nourished, and in no distress. No distress.  HENT:  Head: Normocephalic and atraumatic.  Eyes: Conjunctivae are normal.  Neck: Neck supple. No thyromegaly present.  Cardiovascular:  Normal rate, regular rhythm and normal heart sounds.   No murmur heard. Pulmonary/Chest: Effort normal and breath sounds normal. No respiratory distress.  Abdominal: He exhibits no distension and no mass. There is no tenderness.  Musculoskeletal: He exhibits no edema.  Neurological: He is alert and oriented to person, place, and time.  Skin: Skin is warm.  Psychiatric: Memory, affect and judgment normal.    Lab Results  Component Value Date   TSH 0.829 12/30/2012   Lab Results  Component Value Date   WBC 7.0 12/30/2012   HGB 15.0 12/30/2012   HCT 43.2 12/30/2012   MCV 86.9 12/30/2012   PLT 224 12/30/2012   Lab Results  Component Value Date   CREATININE 1.02 05/11/2013   BUN 15 05/11/2013   NA 140 05/11/2013   K 3.7 05/11/2013   CL 105 05/11/2013   CO2 28 05/11/2013   Lab Results  Component Value Date   ALT 11 12/30/2012   AST 15 12/30/2012   ALKPHOS 57 12/30/2012   BILITOT 1.0 12/30/2012   Lab Results  Component Value Date   CHOL 149 12/30/2012   Lab Results  Component Value Date   HDL 40 12/30/2012   Lab Results  Component Value Date   LDLCALC 86 12/30/2012   Lab Results  Component Value Date   TRIG 114 12/30/2012   Lab Results  Component Value Date   CHOLHDL 3.7 12/30/2012     Assessment & Plan  Colon cancer Doing well is due for repeat visit to GI for surveillance. Encouraged probiotics and fiber supplement  HYPERLIPIDEMIA Tolerating Lipitor, avoid trans fats, increase exercise  Nocturia Has been out of flomax for several weeks is given a new refill.   Depression with anxiety Is doing well without meds at this time.

## 2013-07-06 NOTE — Assessment & Plan Note (Signed)
Has been out of flomax for several weeks is given a new refill.

## 2013-07-06 NOTE — Assessment & Plan Note (Signed)
Is doing well without meds at this time.

## 2013-07-06 NOTE — Assessment & Plan Note (Signed)
Tolerating Lipitor, avoid trans fats, increase exercise.  

## 2013-08-27 ENCOUNTER — Other Ambulatory Visit: Payer: Self-pay

## 2013-09-01 ENCOUNTER — Ambulatory Visit (AMBULATORY_SURGERY_CENTER): Payer: BC Managed Care – PPO

## 2013-09-01 VITALS — Ht 70.0 in | Wt 204.2 lb

## 2013-09-01 DIAGNOSIS — Z85038 Personal history of other malignant neoplasm of large intestine: Secondary | ICD-10-CM

## 2013-09-01 MED ORDER — MOVIPREP 100 G PO SOLR: ORAL | Status: DC

## 2013-09-04 ENCOUNTER — Encounter: Payer: Self-pay | Admitting: Internal Medicine

## 2013-09-08 ENCOUNTER — Encounter: Payer: Self-pay | Admitting: Internal Medicine

## 2013-09-08 ENCOUNTER — Ambulatory Visit (AMBULATORY_SURGERY_CENTER): Payer: BC Managed Care – PPO | Admitting: Internal Medicine

## 2013-09-08 VITALS — BP 134/81 | HR 72 | Temp 98.0°F | Resp 42 | Ht 70.0 in | Wt 204.0 lb

## 2013-09-08 DIAGNOSIS — Z1211 Encounter for screening for malignant neoplasm of colon: Secondary | ICD-10-CM

## 2013-09-08 DIAGNOSIS — Z85038 Personal history of other malignant neoplasm of large intestine: Secondary | ICD-10-CM

## 2013-09-08 DIAGNOSIS — C189 Malignant neoplasm of colon, unspecified: Secondary | ICD-10-CM

## 2013-09-08 MED ORDER — SODIUM CHLORIDE 0.9 % IV SOLN: 500.0000 mL | INTRAVENOUS | Status: DC

## 2013-09-08 NOTE — Progress Notes (Signed)
Patient did not experience any of the following events: a burn prior to discharge; a fall within the facility; wrong site/side/patient/procedure/implant event; or a hospital transfer or hospital admission upon discharge from the facility. (G8907)Patient did not have preoperative order for IV antibiotic SSI prophylaxis. ([id number]) ewm 

## 2013-09-08 NOTE — Op Note (Signed)
[facility] [address] [place], [address]   COLONOSCOPY PROCEDURE REPORT  PATIENT: Tony, Paul  MR#: [id number] BIRTHDATE: 09-25-1961 , 52  yrs. old GENDER: Male ENDOSCOPIST: Beverley Fiedler, MD PROCEDURE DATE:  09/08/2013 PROCEDURE:   Colonoscopy, surveillance First Screening Colonoscopy - Avg.  risk and is 50 yrs.  old or older - No.  Prior Negative Screening - Now for repeat screening. N/A  History of Adenoma - Now for follow-up colonoscopy & has been > or = to 3 yrs.  No.  It has been less than 3 yrs since last colonoscopy.  Medical reason.  Polyps Removed Today? No.  Recommend repeat exam, <10 yrs? Yes.  High risk (family or personal hx). ASA CLASS:   Class III INDICATIONS:elevated risk screening and High risk patient with personal history of sigmoid colon cancer 2013 (s/p LAR). MEDICATIONS: MAC sedation, administered by CRNA and propofol (Diprivan) 350mg  IV  DESCRIPTION OF PROCEDURE:   After the risks benefits and alternatives of the procedure were thoroughly explained, informed consent was obtained.  A digital rectal exam revealed no rectal mass.   The LB [id number]  endoscope was introduced through the anus and advanced to the cecum, which was identified by both the appendix and ileocecal valve. No adverse events experienced. The quality of the prep was good, using MoviPrep  The instrument was then slowly withdrawn as the colon was fully examined.     COLON FINDINGS: There was evidence of a normal and healthy appearing surgical anastomosis in the rectosigmoid colon.   The colon was otherwise normal.  There was no diverticulosis, inflammation, polyps or cancers identified.  Retroflexed views revealed no abnormalities. The time to cecum=3 minutes 08 seconds.  Withdrawal time=10 minutes 36 seconds.  The scope was withdrawn and the procedure completed.  COMPLICATIONS: There were no complications.  ENDOSCOPIC IMPRESSION: 1.   There was  evidence of prior surgical anastomosis in the rectosigmoid colon 2.   The colon was otherwise normal  RECOMMENDATIONS: Repeat Colonoscopy in 3 years.   eSigned:  Beverley Fiedler, MD 09/08/2013 2:40 PM   cc: The Patient, Rolm Baptise, MD, and Reuel Derby, MD

## 2013-09-08 NOTE — Patient Instructions (Signed)
YOU HAD AN ENDOSCOPIC PROCEDURE TODAY AT [facility]: Refer to the procedure report that was given to you for any specific questions about what was found during the examination.  If the procedure report does not answer your questions, please call your gastroenterologist to clarify.  If you requested that your care partner not be given the details of your procedure findings, then the procedure report has been included in a sealed envelope for you to review at your convenience later.  YOU SHOULD EXPECT: Some feelings of bloating in the abdomen. Passage of more gas than usual.  Walking can help get rid of the air that was put into your GI tract during the procedure and reduce the bloating. If you had a lower endoscopy (such as a colonoscopy or flexible sigmoidoscopy) you may notice spotting of blood in your stool or on the toilet paper. If you underwent a bowel prep for your procedure, then you may not have a normal bowel movement for a few days.  DIET: Your first meal following the procedure should be a light meal and then it is ok to progress to your normal diet.  A half-sandwich or bowl of soup is an example of a good first meal.  Heavy or fried foods are harder to digest and may make you feel nauseous or bloated.  Likewise meals heavy in dairy and vegetables can cause extra gas to form and this can also increase the bloating.  Drink plenty of fluids but you should avoid alcoholic beverages for 24 hours.  ACTIVITY: Your care partner should take you home directly after the procedure.  You should plan to take it easy, moving slowly for the rest of the day.  You can resume normal activity the day after the procedure however you should NOT DRIVE or use heavy machinery for 24 hours (because of the sedation medicines used during the test).    SYMPTOMS TO REPORT IMMEDIATELY: A gastroenterologist can be reached at any hour.  During normal business hours, 8:30 AM to 5:00 PM Monday through Friday,  call [phone].  After hours and on weekends, please call the GI answering service at [phone]  Emergency number who will take a message and have the physician on call contact you.   Following lower endoscopy (colonoscopy or flexible sigmoidoscopy):  Excessive amounts of blood in the stool  Significant tenderness or worsening of abdominal pains  Swelling of the abdomen that is new, acute  Fever of 100F or higher FOLLOW UP: If any biopsies were taken you will be contacted by phone or by letter within the next 1-3 weeks.  Call your gastroenterologist if you have not heard about the biopsies in 3 weeks.  Our staff will call the home number listed on your records the next business day following your procedure to check on you and address any questions or concerns that you may have at that time regarding the information given to you following your procedure. This is a courtesy call and so if there is no answer at the home number and we have not heard from you through the emergency physician on call, we will assume that you have returned to your regular daily activities without incident.  SIGNATURES/CONFIDENTIALITY: You and/or your care partner have signed paperwork which will be entered into your electronic medical record.  These signatures attest to the fact that that the information above on your After Visit Summary has been reviewed and is understood.  Full responsibility of the confidentiality  of this discharge information lies with you and/or your care-partner.  Repeat colon in 3 years

## 2013-09-09 ENCOUNTER — Telehealth: Payer: Self-pay | Admitting: *Deleted

## 2013-09-09 ENCOUNTER — Other Ambulatory Visit: Payer: Self-pay | Admitting: Family Medicine

## 2013-09-09 DIAGNOSIS — R0683 Snoring: Secondary | ICD-10-CM

## 2013-09-09 NOTE — Telephone Encounter (Signed)
  Follow up Call-  Call back number 09/08/2013 09/01/2012  Post procedure Call Back phone  # [phone] cell [phone]  Permission to leave phone message Yes Yes     Patient questions:  Do you have a fever, pain , or abdominal swelling? no Pain Score  0 *  Have you tolerated food without any problems? yes  Have you been able to return to your normal activities? yes  Do you have any questions about your discharge instructions: Diet   no Medications  no Follow up visit  no  Do you have questions or concerns about your Care? no  Actions: * If pain score is 4 or above: No action needed, pain <4.

## 2013-09-15 ENCOUNTER — Encounter: Payer: BC Managed Care – PPO | Admitting: Internal Medicine

## 2013-09-25 ENCOUNTER — Encounter: Payer: Self-pay | Admitting: Physician Assistant

## 2013-09-25 ENCOUNTER — Ambulatory Visit (INDEPENDENT_AMBULATORY_CARE_PROVIDER_SITE_OTHER): Payer: BC Managed Care – PPO | Admitting: Physician Assistant

## 2013-09-25 VITALS — BP 104/52 | HR 87 | Temp 98.3°F | Resp 18 | Ht 70.0 in | Wt 208.2 lb

## 2013-09-25 DIAGNOSIS — J329 Chronic sinusitis, unspecified: Secondary | ICD-10-CM

## 2013-09-25 MED ORDER — FLUTICASONE PROPIONATE 50 MCG/ACT NA SUSP: 2.0000 | Freq: Every day | NASAL | Status: DC

## 2013-09-25 MED ORDER — AMOXICILLIN-POT CLAVULANATE 875-125 MG PO TABS: 1.0000 | ORAL_TABLET | Freq: Two times a day (BID) | ORAL | Status: DC

## 2013-09-25 NOTE — Progress Notes (Signed)
Pre visit review using our clinic review tool, if applicable. No additional management support is needed unless otherwise documented below in the visit note/SLS  

## 2013-09-25 NOTE — Patient Instructions (Signed)
Increase fluid intake.  Take a daily probiotic.  Saline nasal spray and Flonase.  Claritin at bedtime.  Can use OTC Mucinex.  Rest.  Humidifier in bedroom.  If symptoms not improving within 24 hours, or worsening, please fill prescription for antibiotic and begin taking as prescribed with food.  Sinusitis Sinusitis is redness, soreness, and swelling (inflammation) of the paranasal sinuses. Paranasal sinuses are air pockets within the bones of your face (beneath the eyes, the middle of the forehead, or above the eyes). In healthy paranasal sinuses, mucus is able to drain out, and air is able to circulate through them by way of your nose. However, when your paranasal sinuses are inflamed, mucus and air can become trapped. This can allow bacteria and other germs to grow and cause infection. Sinusitis can develop quickly and last only a short time (acute) or continue over a long period (chronic). Sinusitis that lasts for more than 12 weeks is considered chronic.  CAUSES  Causes of sinusitis include:  Allergies.  Structural abnormalities, such as displacement of the cartilage that separates your nostrils (deviated septum), which can decrease the air flow through your nose and sinuses and affect sinus drainage.  Functional abnormalities, such as when the small hairs (cilia) that line your sinuses and help remove mucus do not work properly or are not present. SYMPTOMS  Symptoms of acute and chronic sinusitis are the same. The primary symptoms are pain and pressure around the affected sinuses. Other symptoms include:  Upper toothache.  Earache.  Headache.  Bad breath.  Decreased sense of smell and taste.  A cough, which worsens when you are lying flat.  Fatigue.  Fever.  Thick drainage from your nose, which often is green and may contain pus (purulent).  Swelling and warmth over the affected sinuses. DIAGNOSIS  Your caregiver will perform a physical exam. During the exam, your caregiver  may:  Look in your nose for signs of abnormal growths in your nostrils (nasal polyps).  Tap over the affected sinus to check for signs of infection.  View the inside of your sinuses (endoscopy) with a special imaging device with a light attached (endoscope), which is inserted into your sinuses. If your caregiver suspects that you have chronic sinusitis, one or more of the following tests may be recommended:  Allergy tests.  Nasal culture A sample of mucus is taken from your nose and sent to a lab and screened for bacteria.  Nasal cytology A sample of mucus is taken from your nose and examined by your caregiver to determine if your sinusitis is related to an allergy. TREATMENT  Most cases of acute sinusitis are related to a viral infection and will resolve on their own within 10 days. Sometimes medicines are prescribed to help relieve symptoms (pain medicine, decongestants, nasal steroid sprays, or saline sprays).  However, for sinusitis related to a bacterial infection, your caregiver will prescribe antibiotic medicines. These are medicines that will help kill the bacteria causing the infection.  Rarely, sinusitis is caused by a fungal infection. In theses cases, your caregiver will prescribe antifungal medicine. For some cases of chronic sinusitis, surgery is needed. Generally, these are cases in which sinusitis recurs more than 3 times per year, despite other treatments. HOME CARE INSTRUCTIONS   Drink plenty of water. Water helps thin the mucus so your sinuses can drain more easily.  Use a humidifier.  Inhale steam 3 to 4 times a day (for example, sit in the bathroom with the shower running).  Apply a warm, moist washcloth to your face 3 to 4 times a day, or as directed by your caregiver.  Use saline nasal sprays to help moisten and clean your sinuses.  Take over-the-counter or prescription medicines for pain, discomfort, or fever only as directed by your caregiver. SEEK IMMEDIATE  MEDICAL CARE IF:  You have increasing pain or severe headaches.  You have nausea, vomiting, or drowsiness.  You have swelling around your face.  You have vision problems.  You have a stiff neck.  You have difficulty breathing. MAKE SURE YOU:   Understand these instructions.  Will watch your condition.  Will get help right away if you are not doing well or get worse. Document Released: 10/08/2005 Document Revised: 12/31/2011 Document Reviewed: 10/23/2011 [facility] Patient Information 2014 [place], [place].

## 2013-09-26 DIAGNOSIS — J329 Chronic sinusitis, unspecified: Secondary | ICD-10-CM | POA: Insufficient documentation

## 2013-09-26 NOTE — Progress Notes (Signed)
Patient ID: Tony Paul, male   DOB: Dec 02, 1960, 52 y.o.   MRN: [id number]  Patient presents to clinic today complaining of sinus pressure, sinus pain, postnasal drainage with sore throat and nonproductive cough x 3 days. Patient also endorses some body aches and fatigue.  Symptom onset was gradual. Patient denies fever. Patient denies significant allergy, asthma or recent sick contact. Hasn't taken anything for symptoms.  Past Medical History  Diagnosis Date  . HLD (hyperlipidemia)   . Rosacea   . Health maintenance examination   . Dyspnea     on exertion  . 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 Heart murmur   . Depression with anxiety 01/04/2013  . Nocturia 04/09/2013  . Restless sleeper 04/09/2013  . colon ca dx'd 08/2012    Current Outpatient Prescriptions on File Prior to Visit  Medication Sig Dispense Refill  . aspirin EC 81 MG tablet Take 81 mg by mouth daily. On occasions      . atorvastatin (LIPITOR) 20 MG tablet Take 1 tablet (20 mg total) by mouth daily.  90 tablet  1  . B Complex-C (SUPER B COMPLEX PO) Take by mouth daily.      . fish oil-omega-3 fatty acids 1000 MG capsule Take 3-4 g by mouth daily as needed. On occasion      . metroNIDAZOLE (METROGEL) 0.75 % gel Apply 1 application topically as needed.       . Multiple Vitamin (MULTIVITAMIN) tablet Take 1 tablet by mouth daily.      . Sulfacetamide Sodium-Sulfur 10-5 % CREA Apply topically as needed.       . tamsulosin (FLOMAX) 0.4 MG CAPS capsule Take 1 capsule (0.4 mg total) by mouth daily.  90 capsule  1   No current facility-administered medications on file prior to visit.    No Known Allergies  Family History  Problem Relation Age of Onset  . Heart failure Father   . Kidney disease Father   . Hypertension Other   . Hyperlipidemia Other   . Kidney disease Other   . Colon cancer Neg Hx   . Prostate cancer Neg Hx   . Esophageal cancer Neg Hx   . Rectal cancer Neg Hx   . Stomach cancer  Neg Hx     History   Social History  . Marital Status: Married    Spouse Name: N/A    Number of Children: N/A  . Years of Education: N/A   Social History Main Topics  . Smoking status: Never Smoker   . Smokeless tobacco: Never Used  . Alcohol Use: 0.6 oz/week    1 Glasses of wine per week     Comment: some weeks none  . Drug Use: No  . Sexual Activity: None   Other Topics Concern  . None   Social History Narrative   Nurse, adult for [employer]   Married 23 years   3 sons 21, 19,14    Review of Systems - see history of present illness. All other review of systems are negative.   Filed Vitals:   09/25/13 0929  BP: 104/52  Pulse: 87  Temp: 98.3 F (36.8 C)  Resp: 18   Physical Exam  Vitals reviewed. Constitutional: He is oriented to person, place, and time and well-developed, well-nourished, and in no distress.  HENT:  Head: Normocephalic and atraumatic.  Right Ear: External ear normal.  Left Ear: External ear normal.  Nose: Nose normal.  Mouth/Throat: Oropharynx  is clear and moist. No oropharyngeal exudate.  Tympanic membranes within normal limits bilaterally. No tenderness to percussion of sinuses noted on exam.  Eyes: Conjunctivae are normal.  Neck: Neck supple.  Cardiovascular: Normal rate, regular rhythm, normal heart sounds and intact distal pulses.   Pulmonary/Chest: Effort normal and breath sounds normal. No respiratory distress. He has no wheezes. He has no rales. He exhibits no tenderness.  Lymphadenopathy:    He has no cervical adenopathy.  Neurological: He is alert and oriented to person, place, and time.  Skin: Skin is warm and dry. No rash noted.  Psychiatric: Affect normal.   Assessment/Plan: Sinusitis Giving short duration of symptoms, most likely viral.  Encouraged increased fluids. Rest. Probiotic. Saline nasal spray. Rx Flonase. Over-the-counter medications for symptomatic care.  Rx for Augmentin printed and given to patient in case  symptoms acutely worsen over the weekend.

## 2013-09-26 NOTE — Assessment & Plan Note (Signed)
Giving short duration of symptoms, most likely viral.  Encouraged increased fluids. Rest. Probiotic. Saline nasal spray. Rx Flonase. Over-the-counter medications for symptomatic care.  Rx for Augmentin printed and given to patient in case symptoms acutely worsen over the weekend.

## 2013-10-07 ENCOUNTER — Other Ambulatory Visit: Payer: BC Managed Care – PPO | Admitting: Physician Assistant

## 2013-10-07 ENCOUNTER — Ambulatory Visit (HOSPITAL_BASED_OUTPATIENT_CLINIC_OR_DEPARTMENT_OTHER): Payer: BC Managed Care – PPO | Attending: Family Medicine | Admitting: Radiology

## 2013-10-07 VITALS — Ht 70.0 in | Wt 205.0 lb

## 2013-10-07 DIAGNOSIS — J329 Chronic sinusitis, unspecified: Secondary | ICD-10-CM

## 2013-10-07 DIAGNOSIS — G4733 Obstructive sleep apnea (adult) (pediatric): Secondary | ICD-10-CM | POA: Insufficient documentation

## 2013-10-07 DIAGNOSIS — R0683 Snoring: Secondary | ICD-10-CM

## 2013-10-07 MED ORDER — AMOXICILLIN-POT CLAVULANATE 875-125 MG PO TABS: 1.0000 | ORAL_TABLET | Freq: Two times a day (BID) | ORAL | Status: DC

## 2013-10-13 ENCOUNTER — Telehealth: Payer: Self-pay | Admitting: Family Medicine

## 2013-10-13 DIAGNOSIS — G4733 Obstructive sleep apnea (adult) (pediatric): Secondary | ICD-10-CM

## 2013-10-13 DIAGNOSIS — R0609 Other forms of dyspnea: Secondary | ICD-10-CM

## 2013-10-13 DIAGNOSIS — R0989 Other specified symptoms and signs involving the circulatory and respiratory systems: Secondary | ICD-10-CM

## 2013-10-13 NOTE — Sleep Study (Signed)
[facility]  NAME: Tony Paul  DATE OF BIRTH: 11/29/1960  MEDICAL RECORD NUMBER [id number]  LOCATION: [facility]  PHYSICIAN: Romero Letizia V.  DATE OF STUDY:10/13/2013   SLEEP STUDY TYPE: Nocturnal Polysomnogram               REFERRING PHYSICIAN: Oretha Milch, MD  INDICATION FOR STUDY: 52 year old man with excessive daytime somnolence, snoring loud and continuous and trouble concentrating. At the time of this study ,they weighed 205 pounds with a height of  5 ft 10 inches and the BMI of 29, neck size of 16 inches. Epworth sleepiness score was 17   This nocturnal polysomnogram was performed with a sleep technologist in attendance. EEG, EOG,EMG and respiratory parameters recorded. Sleep stages, arousals, limb movements and respiratory data was scored according to criteria laid out by the American Academy of sleep medicine.  SLEEP ARCHITECTURE: Lights out was at 21-56 PM and lights on was at 05-16 AM. Total sleep time was 385 minutes with a sleep period time of 434 minutes and a sleep efficiency of 88 %. Sleep latency was 5 minutes with latency to REM sleep of 67 minutes and wake after sleep onset of 49 minutes. . Sleep stages as a percentage of total sleep time was N1 -4.5%,N2- 66% and REM sleep 29% ( 111 minutes) . The longest period of REM sleep was around 1 AM.   AROUSAL DATA : There were 72 arousals with an arousal index of 11 events per hour. Of these 56 were spontaneous, and 16 were associated with respiratory events and 0 were associated periodic limb movements  RESPIRATORY DATA: There were 5 obstructive apneas, 7 central apneas, 5 mixed apneas and 21 hypopneas with apnea -hypopnea index of 6 events per hour. There were 20 RERAs with an RDI of 9 events per hour. There was no relation to sleep stage but events were only noted when supine.Marland Kitchen  MOVEMENT/PARASOMNIA: There were 0 PLMS with a PLM index of 0 events per hour. The PLM arousal index  was 0 events per hour.  OXYGEN DATA: The lowest desaturation was 88% during non-REM sleep and the desaturation index was 4.5 per hour. The saturations stayed below 88% for 0 minutes.  CARDIAC DATA: The low heart rate was 36 beats per minute. The high heart rate recorded was an artifact. No arrhythmias were noted   IMPRESSION :  1. Mild obstructive sleep apnea with hypopneas causing sleep fragmentation and mild oxygen desaturation. Events were only noted during supine sleep 2. No evidence of cardiac arrhythmias or behavioral disturbance during sleep. 3. No periodic limb movements were noted  RECOMMENDATION:    1. The treatment options for this degree of sleep disordered breathing includes positional therapy or CPAP or oral appliance. Reasons to treat would be excessive somnolence and if snoring is bothersome to bed partner. Alternatively no therapy can also be considered. 2. Patient should be cautioned against driving when sleepy 3. They should be asked to avoid medications with sedative side effects  Tiffany Calmes V. Diplomate, Biomedical engineer of Sleep Medicine  ELECTRONICALLY SIGNED ON:  09/22/2013, 1:24 PM [facility] PH: [phone]   FX: [phone] ACCREDITED BY THE AMERICAN ACADEMY OF SLEEP MEDICINE

## 2013-10-13 NOTE — Telephone Encounter (Signed)
Sleep study results received and forwarded to Provider for review.

## 2013-10-13 NOTE — Telephone Encounter (Signed)
Spoke with pt, he states the technician told him that his Provider may want him to return for a second sleep study.  Pt states he has met his deductible for this year and if further testing is needed he would like to have it done this year.  Advised pt we haven't received the result yet and it may not be likely to get further studies done this year. Spoke with Aurther Loft at [facility], [phone]. She states test has not been read yet but anticipates that Dr Vassie Loll will read it today. She will fax result to [place] once it is complete. States they are booked out to the end of January and is unlikely pt can get additional testing done this year if needed.

## 2013-10-13 NOTE — Telephone Encounter (Signed)
Patient states that he feels like he needs a 2nd sleep study done and is hoping to have this done before the end of the year.

## 2013-10-15 NOTE — Telephone Encounter (Signed)
Notify sleep apnea is mild, does not require CPAP. Can consider if excessive sedation  But will not likely be able to get that set up prior to the end of the year. Can also consider a dental appliance, would have to find a dentist at home that do the devices. Also the only apnea was when he was lying flat, could elevate his head some and that will help.

## 2013-10-16 NOTE — Telephone Encounter (Signed)
Mychart message sent.

## 2013-11-16 ENCOUNTER — Ambulatory Visit: Payer: BC Managed Care – PPO | Admitting: Oncology

## 2013-11-16 ENCOUNTER — Other Ambulatory Visit: Payer: BC Managed Care – PPO

## 2013-11-17 ENCOUNTER — Other Ambulatory Visit: Payer: Self-pay | Admitting: *Deleted

## 2013-11-17 ENCOUNTER — Telehealth: Payer: Self-pay | Admitting: Oncology

## 2013-11-17 NOTE — Telephone Encounter (Signed)
gave pt appt for February 2015 lab and MD

## 2013-11-17 NOTE — Progress Notes (Signed)
[place] for 1/26 lab/OV. POF to scheduler to reschedule for 1 month.

## 2013-12-18 ENCOUNTER — Ambulatory Visit (HOSPITAL_BASED_OUTPATIENT_CLINIC_OR_DEPARTMENT_OTHER): Payer: BC Managed Care – PPO | Admitting: Oncology

## 2013-12-18 ENCOUNTER — Other Ambulatory Visit (HOSPITAL_BASED_OUTPATIENT_CLINIC_OR_DEPARTMENT_OTHER): Payer: BC Managed Care – PPO

## 2013-12-18 VITALS — BP 118/71 | HR 77 | Temp 98.3°F | Resp 18 | Ht 70.0 in | Wt 217.2 lb

## 2013-12-18 DIAGNOSIS — K7689 Other specified diseases of liver: Secondary | ICD-10-CM

## 2013-12-18 DIAGNOSIS — C189 Malignant neoplasm of colon, unspecified: Secondary | ICD-10-CM

## 2013-12-18 DIAGNOSIS — C187 Malignant neoplasm of sigmoid colon: Secondary | ICD-10-CM

## 2013-12-18 DIAGNOSIS — L719 Rosacea, unspecified: Secondary | ICD-10-CM

## 2013-12-18 LAB — CEA: CEA: 1 ng/mL (ref 0.0–5.0)

## 2013-12-18 NOTE — Progress Notes (Signed)
   [place]    OFFICE PROGRESS NOTE   INTERVAL HISTORY:   He returns for scheduled followup of colon cancer. He feels well. Good appetite. No difficulty with bowel function. No bleeding. He underwent a surveillance colonoscopy 09/08/2013. There was a normal appearing anastomosis at the rectosigmoid colon. The colon was otherwise normal.  Objective:  Vital signs in last 24 hours:  There were no vitals taken for this visit.    HEENT: Neck without mass Lymphatics: No cervical, supraclavicular, axillary, or inguinal nodes Resp: Lungs clear bilaterally Cardio: Regular rate and rhythm GI: No hepatosplenomegaly, nontender, no mass Vascular: No leg edema   Lab Results:  CEA pending   Medications: I have reviewed the patient's current medications.  Assessment/Plan: 1.Adenocarcinoma of the sigmoid colon, stage II (T3 N0), status post a low anterior resection 10/09/2012, microsatellite stable with no loss of mismatch repair protein expression  2. 6 mm indeterminate liver lesion on a CT of the abdomen 10/24/2012   Stable on a CT 5 2014, felt to represent a cyst-no further imaging recommended 3. Rosacea    Disposition:  He remains in clinical remission from colon cancer. We will followup on the CEA from today. He will return for an office visit and CEA in 6 months.   Betsy Coder, MD  12/18/2013  8:34 AM

## 2013-12-22 ENCOUNTER — Telehealth: Payer: Self-pay | Admitting: Oncology

## 2013-12-22 NOTE — Telephone Encounter (Signed)
, °

## 2014-06-15 ENCOUNTER — Telehealth: Payer: Self-pay | Admitting: Oncology

## 2014-06-15 NOTE — Telephone Encounter (Signed)
Pt cld to r/s due to flight/work conflict......KJ

## 2014-06-17 ENCOUNTER — Ambulatory Visit: Payer: BC Managed Care – PPO | Admitting: Oncology

## 2014-06-17 ENCOUNTER — Other Ambulatory Visit: Payer: BC Managed Care – PPO

## 2014-07-02 ENCOUNTER — Telehealth: Payer: Self-pay | Admitting: Oncology

## 2014-07-02 ENCOUNTER — Ambulatory Visit (HOSPITAL_BASED_OUTPATIENT_CLINIC_OR_DEPARTMENT_OTHER): Payer: No Typology Code available for payment source | Admitting: Oncology

## 2014-07-02 ENCOUNTER — Other Ambulatory Visit: Payer: No Typology Code available for payment source

## 2014-07-02 ENCOUNTER — Encounter: Payer: Self-pay | Admitting: Oncology

## 2014-07-02 VITALS — BP 132/84 | HR 71 | Temp 98.0°F | Resp 19 | Ht 70.0 in | Wt 212.6 lb

## 2014-07-02 DIAGNOSIS — C189 Malignant neoplasm of colon, unspecified: Secondary | ICD-10-CM

## 2014-07-02 DIAGNOSIS — Z23 Encounter for immunization: Secondary | ICD-10-CM

## 2014-07-02 DIAGNOSIS — C187 Malignant neoplasm of sigmoid colon: Secondary | ICD-10-CM

## 2014-07-02 MED ORDER — INFLUENZA VAC SPLIT QUAD 0.5 ML IM SUSY
0.5000 mL | PREFILLED_SYRINGE | Freq: Once | INTRAMUSCULAR | Status: AC
  Administered 2014-07-02: 0.5 mL via INTRAMUSCULAR
  Filled 2014-07-02: qty 0.5

## 2014-07-02 NOTE — Progress Notes (Signed)
   [place]    OFFICE PROGRESS NOTE   INTERVAL HISTORY:   He returns for scheduled followup of colon cancer. He feels well. Good appetite. No difficulty with bowel function. No bleeding. He underwent a surveillance colonoscopy 09/08/2013. There was a normal appearing anastomosis at the rectosigmoid colon. The colon was otherwise normal. He would like a flu vaccine today.  Objective:  Vital signs in last 24 hours:  Blood pressure 132/84, pulse 71, temperature 98 F (36.7 C), temperature source Oral, resp. rate 19, height 5' 10" (1.778 m), weight 212 lb 9.6 oz (96.435 kg), SpO2 99.00%.    HEENT: Neck without mass Lymphatics: No cervical, supraclavicular, axillary, or inguinal nodes Resp: Lungs clear bilaterally Cardio: Regular rate and rhythm GI: No hepatosplenomegaly, nontender, no mass Vascular: No leg edema   Lab Results:  CEA pending   Medications: I have reviewed the patient's current medications.  Assessment/Plan: 1.Adenocarcinoma of the sigmoid colon, stage II (T3 N0), status post a low anterior resection 10/09/2012, microsatellite stable with no loss of mismatch repair protein expression  2. 6 mm indeterminate liver lesion on a CT of the abdomen 10/24/2012   Stable on a CT 5 2014, felt to represent a cyst-no further imaging recommended 3. Rosacea    Disposition:  He remains in clinical remission from colon cancer. We will followup on the CEA from today. He will return for an office visit and CEA in 6 months. Flu vaccine administered today.  Plan reviewed with Dr. Benay Spice.    Mikey Bussing, DNP, AGPCNP-BC  07/02/2014  11:49 AM

## 2014-07-02 NOTE — Telephone Encounter (Signed)
Pt confirmed labs/ov per 09/11 POF, gave pt AVS...Marland KitchenMarland KitchenKJ

## 2014-07-03 LAB — CEA: CEA: 0.8 ng/mL (ref 0.0–5.0)

## 2014-07-12 ENCOUNTER — Encounter: Payer: Self-pay | Admitting: Oncology

## 2014-07-13 NOTE — Telephone Encounter (Signed)
Printed this request for provider review.

## 2014-07-19 ENCOUNTER — Encounter: Payer: Self-pay | Admitting: Oncology

## 2014-07-19 NOTE — Progress Notes (Signed)
Put colon cancer questionaire in registration desk

## 2014-07-21 NOTE — Telephone Encounter (Addendum)
Called at 1154.  Message left on voicemail requesting a call to [facility].  Will re-fax when call received.  Call received and new fax number given.  Tony Paul is standing by the fax at this time.  Faxed to [phone].

## 2014-12-22 ENCOUNTER — Telehealth: Payer: Self-pay | Admitting: Oncology

## 2014-12-31 ENCOUNTER — Other Ambulatory Visit: Payer: No Typology Code available for payment source

## 2014-12-31 ENCOUNTER — Ambulatory Visit: Payer: No Typology Code available for payment source | Admitting: Oncology

## 2015-02-14 ENCOUNTER — Other Ambulatory Visit (HOSPITAL_BASED_OUTPATIENT_CLINIC_OR_DEPARTMENT_OTHER): Payer: No Typology Code available for payment source

## 2015-02-14 ENCOUNTER — Telehealth: Payer: Self-pay | Admitting: Oncology

## 2015-02-14 ENCOUNTER — Ambulatory Visit (HOSPITAL_BASED_OUTPATIENT_CLINIC_OR_DEPARTMENT_OTHER): Payer: No Typology Code available for payment source | Admitting: Oncology

## 2015-02-14 VITALS — BP 131/76 | HR 69 | Temp 98.1°F | Resp 16 | Ht 70.0 in | Wt 216.5 lb

## 2015-02-14 DIAGNOSIS — D2271 Melanocytic nevi of right lower limb, including hip: Secondary | ICD-10-CM | POA: Diagnosis not present

## 2015-02-14 DIAGNOSIS — Z85038 Personal history of other malignant neoplasm of large intestine: Secondary | ICD-10-CM | POA: Diagnosis not present

## 2015-02-14 DIAGNOSIS — C189 Malignant neoplasm of colon, unspecified: Secondary | ICD-10-CM

## 2015-02-14 NOTE — Telephone Encounter (Signed)
Pt confirmed labs/ov per 04/25 POF, gave pt AVS and Calendar.... KJ, pt is checking on a referral with his PCP for dermatology near him

## 2015-02-14 NOTE — Progress Notes (Signed)
  [place] OFFICE PROGRESS NOTE   Diagnosis: Colon cancer  INTERVAL HISTORY:   He returns as scheduled. He feels well. He has noted a dark mole at the right lower leg for the past few months. No other complaint.  Objective:  Vital signs in last 24 hours:  Blood pressure 131/76, pulse 69, temperature 98.1 F (36.7 C), temperature source Oral, resp. rate 16, height $RemoveBe'5\' 10"'[id number]$  (1.778 m), weight 216 lb 8 oz (98.204 kg), SpO2 100 %.    HEENT: Neck without mass Lymphatics: No cervical, supra-clavicular, axillary, or inguinal nodes Resp: Lungs clear bilaterally Cardio: Regular rate and rhythm GI: No hepatosplenomegaly, nontender, no mass Vascular: No leg edema  Skin: There is a 0.5 cm hyperpigmented slightly irregular raised mole at the upper lateral right lower leg    Lab Results  Component Value Date   CEA 0.8 07/02/2014     Medications: I have reviewed the patient's current medications.  Assessment/Plan: 1.Adenocarcinoma of the sigmoid colon, stage II (T3 N0), status post a low anterior resection 10/09/2012, microsatellite stable with no loss of mismatch repair protein expression   Negative surveillance colonoscopy 09/08/2013 2. 6 mm indeterminate liver lesion on a CT of the abdomen 10/24/2012   Stable on a CT 05/26/2013, felt to represent a cyst-no further imaging recommended 3. Rosacea  4. Hyperpigmented mole at the right lower leg-we will make a dermatology referral  Disposition:  Tony Paul remains in clinical remission from colon cancer. We will follow-up on the CEA from today. He will return for an office visit and CEA in 6 months. We made a dermatology referral for evaluation of the right leg mole.  Betsy Coder, MD  02/14/2015  12:46 PM

## 2015-02-15 LAB — CEA: CEA: 1 ng/mL (ref 0.0–5.0)

## 2015-02-17 ENCOUNTER — Telehealth: Payer: Self-pay | Admitting: *Deleted

## 2015-02-17 NOTE — Telephone Encounter (Signed)
Per Dr. Benay Spice; left voice message for pt that CEA is normal; call back if any questions.

## 2015-02-21 ENCOUNTER — Telehealth: Payer: Self-pay | Admitting: *Deleted

## 2015-02-21 NOTE — Telephone Encounter (Signed)
Call from pt's wife requesting referral to be sent to [facility]. Pt called for an appointment but was told our office needed to clear it with his insurance. Called Kim in scheduling to complete referral process.

## 2015-02-23 ENCOUNTER — Telehealth: Payer: Self-pay | Admitting: *Deleted

## 2015-02-23 NOTE — Telephone Encounter (Signed)
-----   Message from Ladell Pier, MD sent at 02/21/2015  5:11 PM EDT ----- Please call patient, cea is normal

## 2015-02-23 NOTE — Telephone Encounter (Signed)
Pt aware of CEA, wanted to know if results had been released to [facility]. YES.

## 2015-03-24 ENCOUNTER — Telehealth: Payer: Self-pay | Admitting: Oncology

## 2015-03-24 NOTE — Telephone Encounter (Signed)
Faxed pt medical records to [facility] °

## 2015-03-28 ENCOUNTER — Encounter: Payer: Self-pay | Admitting: Oncology

## 2015-03-31 ENCOUNTER — Encounter: Payer: Self-pay | Admitting: Nurse Practitioner

## 2015-03-31 NOTE — Telephone Encounter (Signed)
Called and informed patient that Letter will be at the front desk for him to pick up.  Per Elby Showers. Marcello Moores, NP.  Patient verbalized understanding.

## 2015-07-04 IMAGING — CT CT ABDOMEN W/ CM
2 of 5 series · 17 of 46 positions shown, 19 images · IV contrast (OMNIPAQUE)
Comparison: 10/24/2012

CLINICAL DATA: History of colon carcinoma.  Follow-up liver lesion.
Left lower quadrant pain intermittently along the surgical scar
lined.

CT ABDOMEN WITH CONTRAST
TECHNIQUE: Multidetector CT imaging of the abdomen was performed
following the standard protocol during bolus administration of
intravenous contrast.
Contrast: 100mL OMNIPAQUE IOHEXOL 300 MG/ML  SOLN

[Series 2: rtn a/p with · axial · 0.74mm/px · z∈[-342,-142]mm · 14 of 46 slices shown, 16 images]
[im 3/46  soft-tissue]
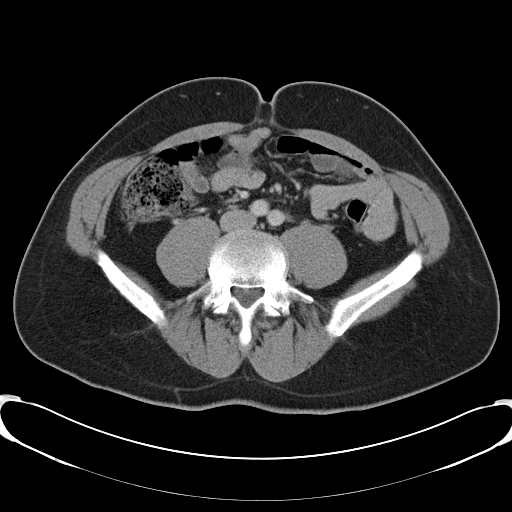
[im 3/46  bone]
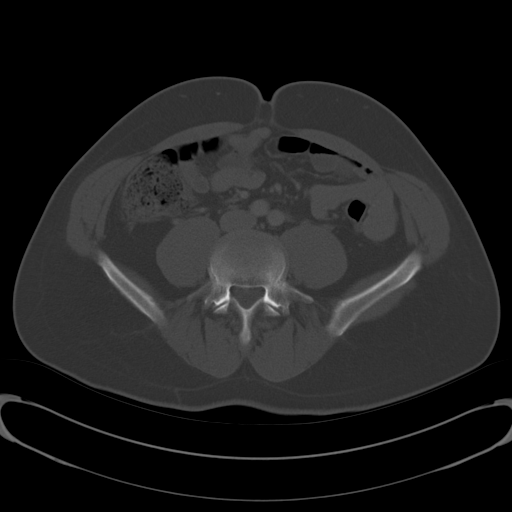
[im 6/46  soft-tissue]
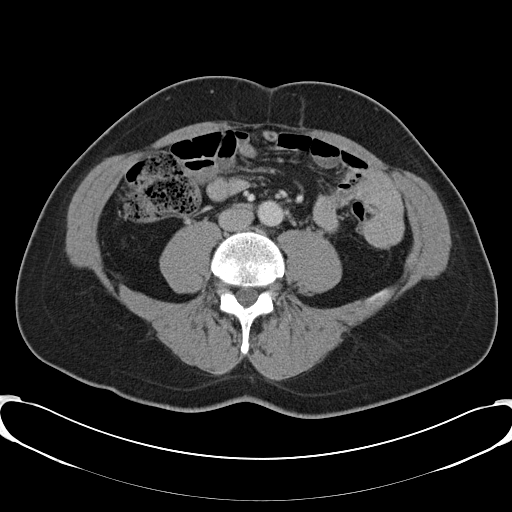
[im 9/46  soft-tissue]
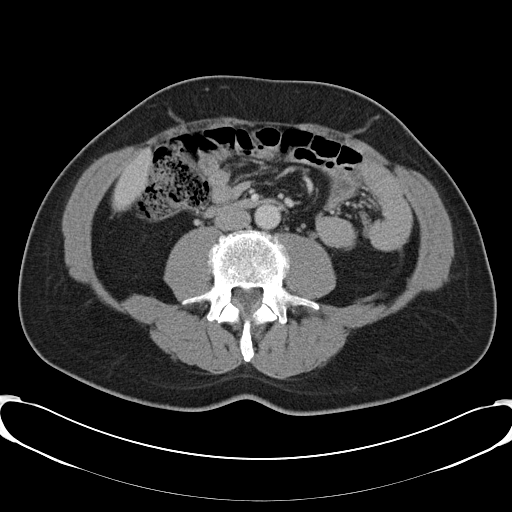
[im 12/46  soft-tissue]
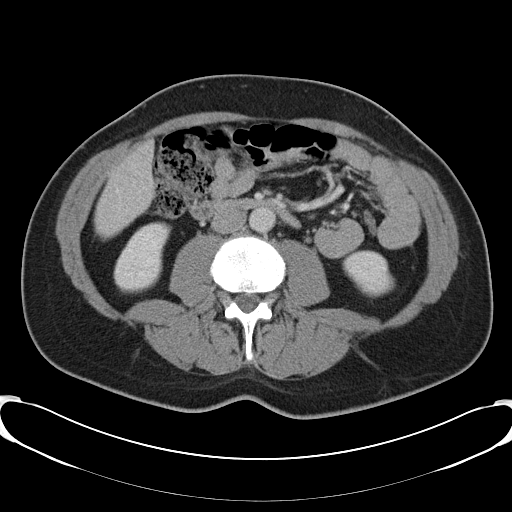
[im 15/46  soft-tissue]
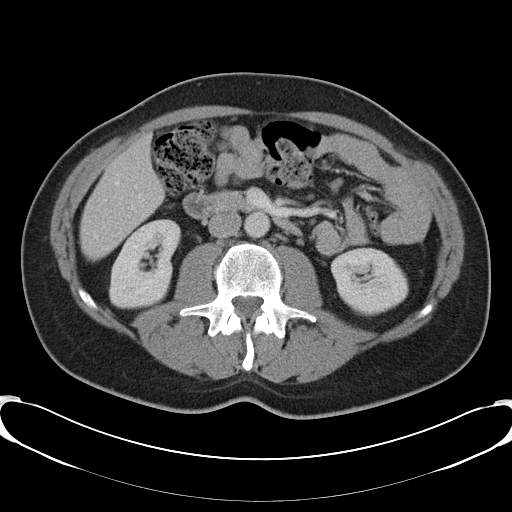
[im 17/46  soft-tissue]
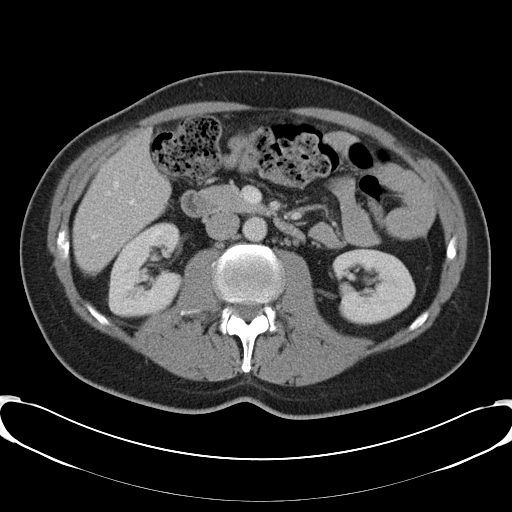
[im 20/46  soft-tissue]
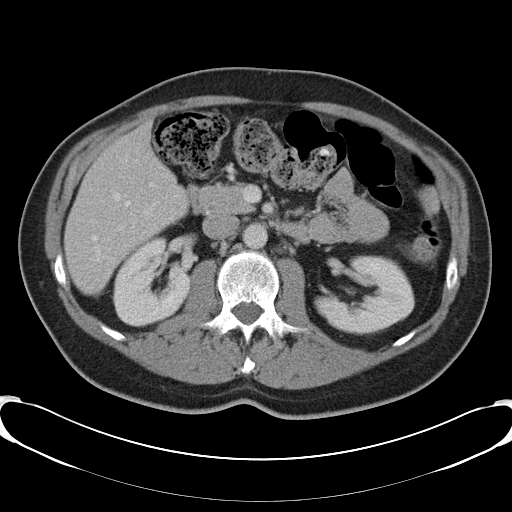
[im 26/46  soft-tissue]
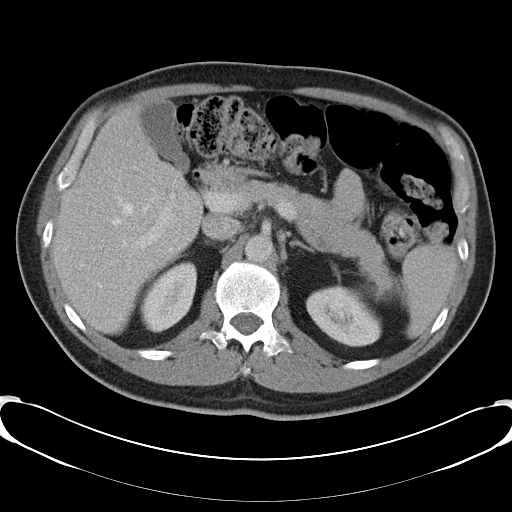
[im 29/46  soft-tissue]
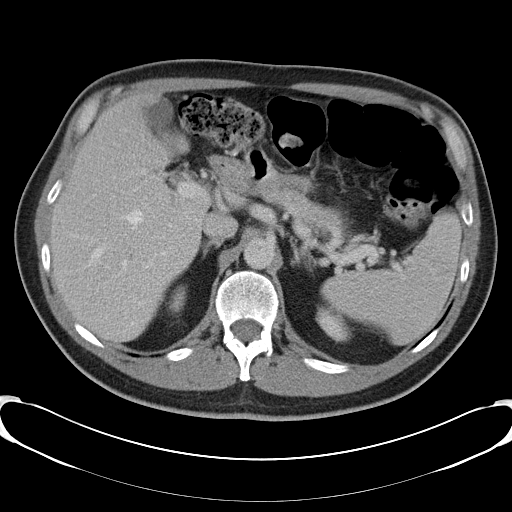
[im 29/46  bone]
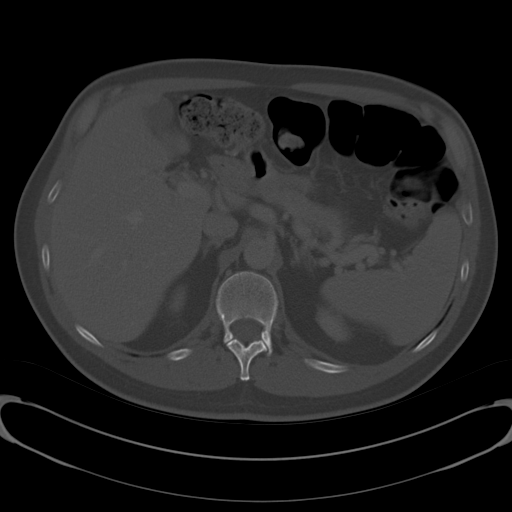
[im 31/46  soft-tissue]
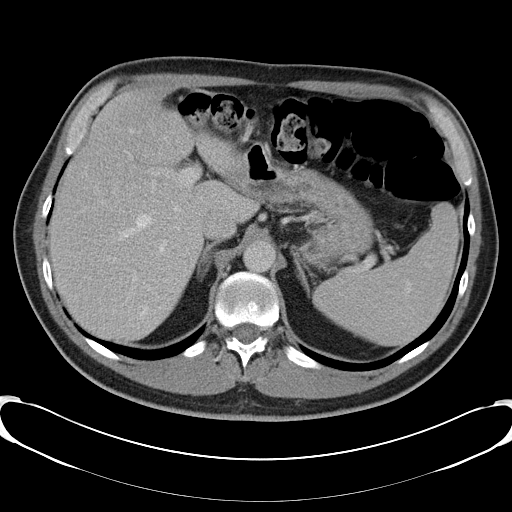
[im 34/46  soft-tissue]
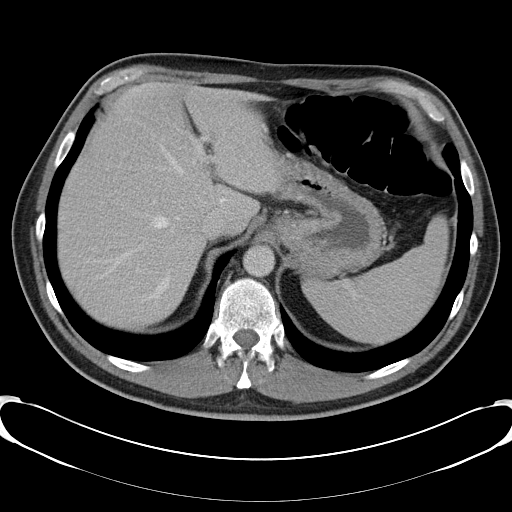
[im 37/46  soft-tissue]
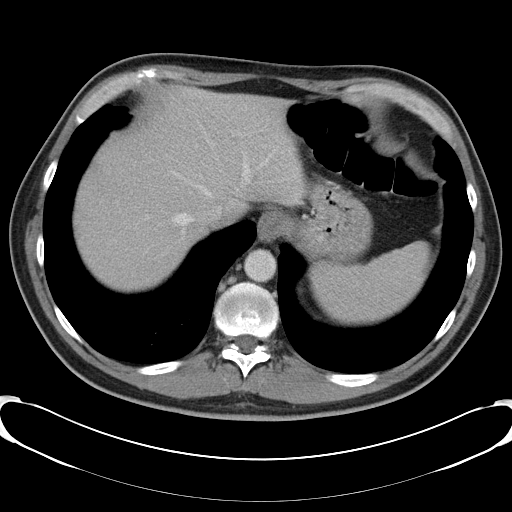
[im 40/46  soft-tissue]
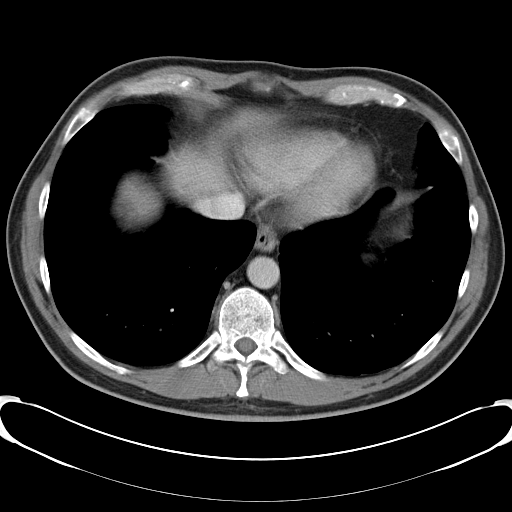
[im 43/46  soft-tissue]
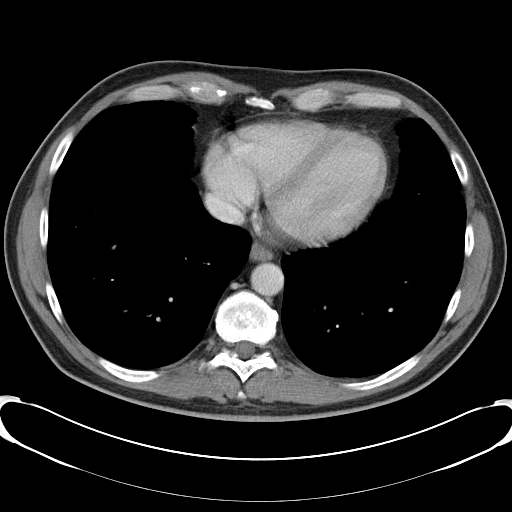

[Series 602: <mpr thick range> · coronal · 0.74mm/px · 3 of 87 slices shown]
[im 29/87  soft-tissue]
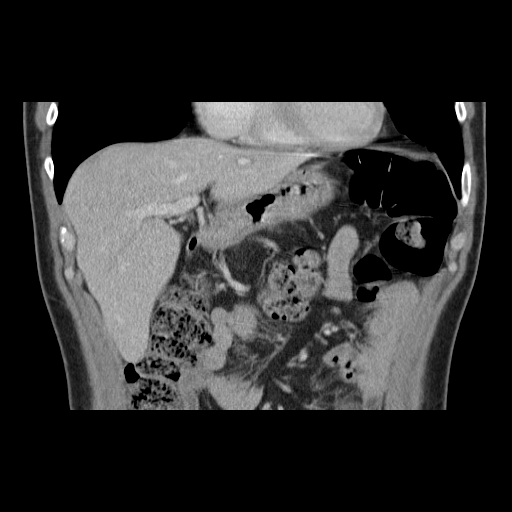
[im 39/87  soft-tissue]
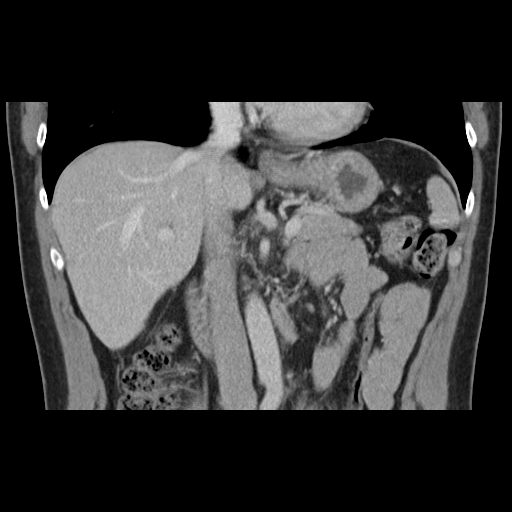
[im 48/87  soft-tissue]
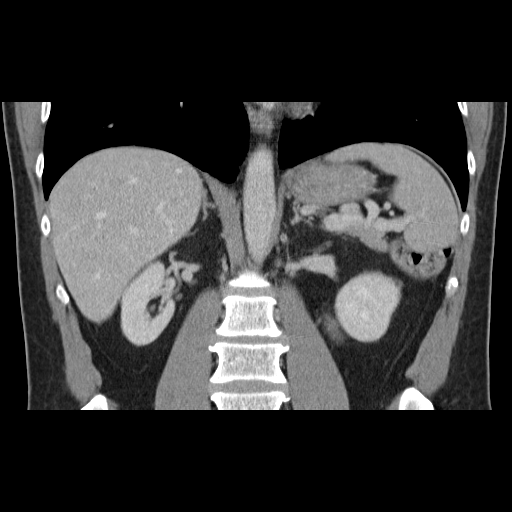

[17 of 46 positions shown; findings below may reference images not displayed]

FINDINGS: The small hypoattenuating, nonenhancing lesion in the
subcapsular lateral aspect of the posterior segment of the right
lobe of the liver is stable.  It measures just over 6 mm.

The liver is otherwise unremarkable.  Normal spleen, gallbladder
and pancreas.  No bile duct dilation.  No adrenal masses.  Tiny low
density lesion is noted along the medial posterior margin of the
left kidney, nonspecific but likely a cyst.  The kidneys are
otherwise unremarkable.  There is no adenopathy.  There are no
abnormal fluid collections.  The visualized bowel shows mild
increase stool the colon but no mass or wall thickening or
inflammatory change.

The lung bases are clear.

There are no osteoblastic or osteolytic lesions.
IMPRESSION: Small lesion in the subcapsular region of the posterior segment of
the right lobe of the liver is a cyst.  This is not concerning for
metastatic disease and needs no additional evaluation.

No evidence of metastatic colon carcinoma on the included field of
view.

## 2015-08-16 ENCOUNTER — Telehealth: Payer: Self-pay | Admitting: Oncology

## 2015-08-16 ENCOUNTER — Ambulatory Visit (HOSPITAL_BASED_OUTPATIENT_CLINIC_OR_DEPARTMENT_OTHER): Payer: BLUE CROSS/BLUE SHIELD | Admitting: Oncology

## 2015-08-16 ENCOUNTER — Other Ambulatory Visit (HOSPITAL_BASED_OUTPATIENT_CLINIC_OR_DEPARTMENT_OTHER): Payer: BLUE CROSS/BLUE SHIELD

## 2015-08-16 VITALS — BP 120/73 | HR 62 | Temp 98.4°F | Resp 18 | Ht 70.0 in | Wt 222.7 lb

## 2015-08-16 DIAGNOSIS — C189 Malignant neoplasm of colon, unspecified: Secondary | ICD-10-CM

## 2015-08-16 DIAGNOSIS — C4371 Malignant melanoma of right lower limb, including hip: Secondary | ICD-10-CM

## 2015-08-16 DIAGNOSIS — L719 Rosacea, unspecified: Secondary | ICD-10-CM

## 2015-08-16 DIAGNOSIS — K769 Liver disease, unspecified: Secondary | ICD-10-CM | POA: Diagnosis not present

## 2015-08-16 DIAGNOSIS — D2271 Melanocytic nevi of right lower limb, including hip: Secondary | ICD-10-CM | POA: Diagnosis not present

## 2015-08-16 DIAGNOSIS — Z85038 Personal history of other malignant neoplasm of large intestine: Secondary | ICD-10-CM

## 2015-08-16 NOTE — Progress Notes (Signed)
  [place] OFFICE PROGRESS NOTE   Diagnosis: Colon cancer  INTERVAL HISTORY:   Mr. Baller returns as scheduled. He feels well. No difficulty with bowel function. 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 He underwent a wide local excision of a right lower extremities melanoma at [facility] with a sentinel lymph node biopsy on 04/29/2015. The pathology was negative for residual melanoma. Sentinel lymph nodes were negative. He was diagnosed with a stage Ib (T2a,N0) melanoma. He has a follow-up schedule with dermatology and surgery. He reports swelling of the right leg following surgery. This has improved.  Objective:  Vital signs in last 24 hours:  There were no vitals taken for this visit.    HEENT: Neck without mass Lymphatics: No cervical, supra-clavicular, axillary, or inguinal nodes Resp: Lungs clear bilaterally Cardio: Regular rate and rhythm GI: No hepatosplenomegaly, no mass Vascular: The right lower leg is slightly larger than the left side, no edema  Skin: Right calf and right groin surgical site without evidence of recurrent melanoma    Lab Results  Component Value Date   CEA 1.0 02/14/2015    Imaging:  No results found.  Medications: I have reviewed the patient's current medications.  Assessment/Plan: 1.Adenocarcinoma of the sigmoid colon, stage II (T3 N0), status post a low anterior resection 10/09/2012, microsatellite stable with no loss of mismatch repair protein expression   Negative surveillance colonoscopy 09/08/2013 2. 6 mm indeterminate liver lesion on a CT of the abdomen 10/24/2012   Stable on a CT 05/26/2013, felt to represent a cyst-no further imaging recommended 3. Rosacea  4. Hyperpigmented mole at the right lower leg 5. Stage IB (T2a,N0) melanoma of the right popliteal fossa, status post a wide excision and sentinel lymph node biopsy procedure at [facility] on 04/29/2015    Disposition:  Tony Paul remains in  clinical remission from colon cancer. We will follow-up on the CEA from today. He will continue follow-up with dermatology and surgical oncology after being diagnosed with a melanoma of the right leg. He will return for an office visit here in 8 months.  Betsy Coder, MD  08/16/2015  12:20 PM

## 2015-08-16 NOTE — Telephone Encounter (Signed)
Gave adn printed papt sched and avs for pt for June 2017

## 2015-08-17 LAB — CEA: CEA: 0.6 ng/mL (ref 0.0–5.0)

## 2016-04-16 ENCOUNTER — Ambulatory Visit (HOSPITAL_BASED_OUTPATIENT_CLINIC_OR_DEPARTMENT_OTHER): Payer: BLUE CROSS/BLUE SHIELD | Admitting: Oncology

## 2016-04-16 ENCOUNTER — Other Ambulatory Visit (HOSPITAL_BASED_OUTPATIENT_CLINIC_OR_DEPARTMENT_OTHER): Payer: BLUE CROSS/BLUE SHIELD

## 2016-04-16 VITALS — BP 124/80 | HR 67 | Temp 98.4°F | Resp 18 | Ht 70.0 in | Wt 212.7 lb

## 2016-04-16 DIAGNOSIS — Z85820 Personal history of malignant melanoma of skin: Secondary | ICD-10-CM

## 2016-04-16 DIAGNOSIS — Z85038 Personal history of other malignant neoplasm of large intestine: Secondary | ICD-10-CM

## 2016-04-16 DIAGNOSIS — C189 Malignant neoplasm of colon, unspecified: Secondary | ICD-10-CM

## 2016-04-16 DIAGNOSIS — C187 Malignant neoplasm of sigmoid colon: Secondary | ICD-10-CM

## 2016-04-16 DIAGNOSIS — R5381 Other malaise: Secondary | ICD-10-CM | POA: Diagnosis not present

## 2016-04-16 NOTE — Progress Notes (Signed)
  [place] OFFICE PROGRESS NOTE   Diagnosis: Colon cancer  INTERVAL HISTORY:   Tony Paul returns as scheduled. He reports an intentional weight loss. He complains of malaise. Good appetite. He saw his term otologist last week.  Objective:  Vital signs in last 24 hours:  Blood pressure 124/80, pulse 67, temperature 98.4 F (36.9 C), temperature source Oral, resp. rate 18, height '5\' 10"'$  (1.778 m), weight 212 lb 11.2 oz (96.48 kg), SpO2 99 %.    HEENT: Neck without mass Lymphatics: No cervical, supraclavicular, axillary, or inguinal nodes Resp: Lungs clear bilaterally Cardio: Regular rate and rhythm GI: No hepatosplenomegaly, no mass, nontender Vascular: No leg edema  Skin: Right upper calf scar and right groin scar without evidence of recurrent tumor   Medications: I have reviewed the patient's current medications.  Assessment/Plan: 1.Adenocarcinoma of the sigmoid colon, stage II (T3 N0), status post a low anterior resection 10/09/2012, microsatellite stable with no loss of mismatch repair protein expression   Negative surveillance colonoscopy 09/08/2013 2. 6 mm indeterminate liver lesion on a CT of the abdomen 10/24/2012   Stable on a CT 05/26/2013, felt to represent a cyst-no further imaging recommended 3. Rosacea  4. History of Hyperpigmented mole at the right lower leg  Stage IB (T2a,N0) melanoma of the right popliteal fossa, status post a wide excision and sentinel lymph node biopsy procedure at [facility] on 04/29/2015     Disposition:  He remains in clinical remission from colon cancer and melanoma. We will refer him to Dr. Hilarie Fredrickson for a surveillance colonoscopy. We will follow-up on the CEA from today. Tony Paul will return for an office visit and CEA in 8 months.  He continues follow-up with dermatology after being diagnosed with a melanoma last year.  I recommended he see Dr. Charlett Blake to evaluate the malaise.    Tony Coder,  MD  04/16/2016  12:45 PM

## 2016-04-17 LAB — CEA (PARALLEL TESTING): CEA: 0.5 ng/mL

## 2016-06-28 ENCOUNTER — Encounter: Payer: Self-pay | Admitting: Internal Medicine

## 2016-09-03 ENCOUNTER — Ambulatory Visit (AMBULATORY_SURGERY_CENTER): Payer: Self-pay | Admitting: *Deleted

## 2016-09-03 VITALS — Ht 70.0 in | Wt 218.0 lb

## 2016-09-03 DIAGNOSIS — Z85038 Personal history of other malignant neoplasm of large intestine: Secondary | ICD-10-CM

## 2016-09-03 MED ORDER — NA SULFATE-K SULFATE-MG SULF 17.5-3.13-1.6 GM/177ML PO SOLN: ORAL | 0 refills | Status: DC

## 2016-09-03 NOTE — Progress Notes (Signed)
Patient denies any allergies to eggs or soy. Patient denies any problems with anesthesia/sedation. Patient denies any oxygen use at home and does not take any diet/weight loss medications.  

## 2016-09-07 ENCOUNTER — Encounter: Payer: Self-pay | Admitting: Internal Medicine

## 2016-09-17 ENCOUNTER — Ambulatory Visit (AMBULATORY_SURGERY_CENTER): Payer: BLUE CROSS/BLUE SHIELD | Admitting: Internal Medicine

## 2016-09-17 ENCOUNTER — Encounter: Payer: Self-pay | Admitting: Internal Medicine

## 2016-09-17 VITALS — BP 116/74 | HR 72 | Temp 98.0°F | Resp 17 | Ht 70.0 in | Wt 218.0 lb

## 2016-09-17 DIAGNOSIS — Z85038 Personal history of other malignant neoplasm of large intestine: Secondary | ICD-10-CM | POA: Diagnosis present

## 2016-09-17 DIAGNOSIS — D123 Benign neoplasm of transverse colon: Secondary | ICD-10-CM | POA: Diagnosis not present

## 2016-09-17 MED ORDER — SODIUM CHLORIDE 0.9 % IV SOLN: 500.0000 mL | INTRAVENOUS | Status: DC

## 2016-09-17 NOTE — Progress Notes (Signed)
Report to PACU, RN, vss, BBS= Clear.  

## 2016-09-17 NOTE — Op Note (Signed)
[facility] Patient Name: Tony Paul Procedure Date: 09/17/2016 8:21 AM MRN: [id number] Endoscopist: Jerene Bears , MD Age: 55 Referring MD:  Date of Birth: [birth year] Gender: Male Account #: [id number] Procedure:                Colonoscopy Indications:              High risk colon cancer surveillance: Personal                            history of colon cancer, Last colonoscopy 3 years                            ago Medicines:                Monitored Anesthesia Care Procedure:                Pre-Anesthesia Assessment:                           - Prior to the procedure, a History and Physical                            was performed, and patient medications and                            allergies were reviewed. The patient's tolerance of                            previous anesthesia was also reviewed. The risks                            and benefits of the procedure and the sedation                            options and risks were discussed with the patient.                            All questions were answered, and informed consent                            was obtained. Prior Anticoagulants: The patient has                            taken no previous anticoagulant or antiplatelet                            agents. ASA Grade Assessment: II - A patient with                            mild systemic disease. After reviewing the risks                            and benefits, the patient was deemed in  satisfactory condition to undergo the procedure.                           After obtaining informed consent, the colonoscope                            was passed under direct vision. Throughout the                            procedure, the patient's blood pressure, pulse, and                            oxygen saturations were monitored continuously. The                            Model CF-HQ190L [id number]) scope was introduced                 through the anus and advanced to the the cecum,                            identified by appendiceal orifice and ileocecal                            valve. The colonoscopy was performed without                            difficulty. The patient tolerated the procedure                            well. The quality of the bowel preparation was                            good. The ileocecal valve, appendiceal orifice, and                            rectum were photographed. Scope In: 8:40:04 AM Scope Out: 8:53:37 AM Scope Withdrawal Time: 0 hours 11 minutes 13 seconds  Total Procedure Duration: 0 hours 13 minutes 33 seconds  Findings:                 The perianal and digital rectal examinations were                            normal.                           A 6 mm polyp was found in the transverse colon. The                            polyp was sessile. The polyp was removed with a                            cold snare. Resection and retrieval were complete.  There was evidence of a prior end-to-end                            colo-colonic anastomosis in the distal sigmoid                            colon. This was patent and was characterized by                            healthy appearing mucosa and an intact staple line.                           The retroflexed view of the distal rectum and anal                            verge was normal and showed no anal or rectal                            abnormalities. Complications:            No immediate complications. Estimated Blood Loss:     Estimated blood loss: none. Impression:               - One 6 mm polyp in the transverse colon, removed                            with a cold snare. Resected and retrieved.                           - Patent end-to-end colo-colonic anastomosis,                            characterized by healthy appearing mucosa and an                            intact staple  line.                           - The distal rectum and anal verge are normal on                            retroflexion view. Recommendation:           - Patient has a contact number available for                            emergencies. The signs and symptoms of potential                            delayed complications were discussed with the                            patient. Return to normal activities tomorrow.  Written discharge instructions were provided to the                            patient.                           - Resume previous diet.                           - Continue present medications.                           - Await pathology results.                           - Repeat colonoscopy for surveillance based on                            pathology results (no longer than 5 years). Jerene Bears, MD 09/17/2016 8:57:03 AM This report has been signed electronically.

## 2016-09-17 NOTE — Patient Instructions (Signed)
Impression/Recommendations:  Polyp handout given to patient.  Repeat colonoscopy in no longer than 5 years for surveillance.  YOU HAD AN ENDOSCOPIC PROCEDURE TODAY AT [facility]:   Refer to the procedure report that was given to you for any specific questions about what was found during the examination.  If the procedure report does not answer your questions, please call your gastroenterologist to clarify.  If you requested that your care partner not be given the details of your procedure findings, then the procedure report has been included in a sealed envelope for you to review at your convenience later.  YOU SHOULD EXPECT: Some feelings of bloating in the abdomen. Passage of more gas than usual.  Walking can help get rid of the air that was put into your GI tract during the procedure and reduce the bloating. If you had a lower endoscopy (such as a colonoscopy or flexible sigmoidoscopy) you may notice spotting of blood in your stool or on the toilet paper. If you underwent a bowel prep for your procedure, you may not have a normal bowel movement for a few days.  Please Note:  You might notice some irritation and congestion in your nose or some drainage.  This is from the oxygen used during your procedure.  There is no need for concern and it should clear up in a day or so.  SYMPTOMS TO REPORT IMMEDIATELY:   Following lower endoscopy (colonoscopy or flexible sigmoidoscopy):  Excessive amounts of blood in the stool  Significant tenderness or worsening of abdominal pains  Swelling of the abdomen that is new, acute  Fever of 100F or higher For urgent or emergent issues, a gastroenterologist can be reached at any hour by calling [phone].   DIET:  We do recommend a small meal at first, but then you may proceed to your regular diet.  Drink plenty of fluids but you should avoid alcoholic beverages for 24 hours.  ACTIVITY:  You should plan to take it easy for the rest of  today and you should NOT DRIVE or use heavy machinery until tomorrow (because of the sedation medicines used during the test).    FOLLOW UP: Our staff will call the number listed on your records the next business day following your procedure to check on you and address any questions or concerns that you may have regarding the information given to you following your procedure. If we do not reach you, we will leave a message.  However, if you are feeling well and you are not experiencing any problems, there is no need to return our call.  We will assume that you have returned to your regular daily activities without incident.  If any biopsies were taken you will be contacted by phone or by letter within the next 1-3 weeks.  Please call us at [phone] if you have not heard about the biopsies in 3 weeks.    SIGNATURES/CONFIDENTIALITY: You and/or your care partner have signed paperwork which will be entered into your electronic medical record.  These signatures attest to the fact that that the information above on your After Visit Summary has been reviewed and is understood.  Full responsibility of the confidentiality of this discharge information lies with you and/or your care-partner.

## 2016-09-17 NOTE — Progress Notes (Signed)
Called to room to assist during endoscopic procedure.  Patient ID and intended procedure confirmed with present staff. Received instructions for my participation in the procedure from the performing physician.  

## 2016-09-18 ENCOUNTER — Telehealth: Payer: Self-pay

## 2016-09-18 NOTE — Telephone Encounter (Signed)
Left a message at [phone] to please call back if any questions or concerns. maw

## 2016-09-18 NOTE — Telephone Encounter (Signed)
  Follow up Call-  Call back number 09/17/2016  Post procedure Call Back phone  # [phone]  Permission to leave phone message Yes  Some recent data might be hidden     Patient questions:  Do you have a fever, pain , or abdominal swelling? No. Pain Score  0 *  Have you tolerated food without any problems? Yes.    Have you been able to return to your normal activities? Yes.    Do you have any questions about your discharge instructions: Diet   No. Medications  No. Follow up visit  No.  Do you have questions or concerns about your Care? No.  Actions: * If pain score is 4 or above: No action needed, pain <4.

## 2016-09-28 ENCOUNTER — Encounter: Payer: Self-pay | Admitting: Internal Medicine

## 2016-11-08 DIAGNOSIS — R05 Cough: Secondary | ICD-10-CM | POA: Diagnosis not present

## 2016-11-08 DIAGNOSIS — R0600 Dyspnea, unspecified: Secondary | ICD-10-CM | POA: Diagnosis not present

## 2016-11-08 DIAGNOSIS — J22 Unspecified acute lower respiratory infection: Secondary | ICD-10-CM | POA: Diagnosis not present

## 2016-11-22 ENCOUNTER — Ambulatory Visit (INDEPENDENT_AMBULATORY_CARE_PROVIDER_SITE_OTHER): Payer: BLUE CROSS/BLUE SHIELD | Admitting: Family Medicine

## 2016-11-22 ENCOUNTER — Encounter: Payer: Self-pay | Admitting: Family Medicine

## 2016-11-22 VITALS — BP 118/82 | HR 78 | Temp 98.1°F | Ht 70.0 in | Wt 226.4 lb

## 2016-11-22 DIAGNOSIS — R51 Headache: Secondary | ICD-10-CM

## 2016-11-22 DIAGNOSIS — C187 Malignant neoplasm of sigmoid colon: Secondary | ICD-10-CM

## 2016-11-22 DIAGNOSIS — M545 Low back pain, unspecified: Secondary | ICD-10-CM | POA: Insufficient documentation

## 2016-11-22 DIAGNOSIS — C4490 Unspecified malignant neoplasm of skin, unspecified: Secondary | ICD-10-CM

## 2016-11-22 DIAGNOSIS — Z0000 Encounter for general adult medical examination without abnormal findings: Secondary | ICD-10-CM

## 2016-11-22 DIAGNOSIS — R350 Frequency of micturition: Secondary | ICD-10-CM

## 2016-11-22 DIAGNOSIS — G44209 Tension-type headache, unspecified, not intractable: Secondary | ICD-10-CM

## 2016-11-22 DIAGNOSIS — J069 Acute upper respiratory infection, unspecified: Secondary | ICD-10-CM | POA: Diagnosis not present

## 2016-11-22 DIAGNOSIS — R519 Headache, unspecified: Secondary | ICD-10-CM | POA: Insufficient documentation

## 2016-11-22 DIAGNOSIS — B9789 Other viral agents as the cause of diseases classified elsewhere: Secondary | ICD-10-CM

## 2016-11-22 DIAGNOSIS — M79672 Pain in left foot: Secondary | ICD-10-CM | POA: Diagnosis not present

## 2016-11-22 DIAGNOSIS — R351 Nocturia: Secondary | ICD-10-CM

## 2016-11-22 DIAGNOSIS — E785 Hyperlipidemia, unspecified: Secondary | ICD-10-CM | POA: Diagnosis not present

## 2016-11-22 DIAGNOSIS — G8929 Other chronic pain: Secondary | ICD-10-CM | POA: Diagnosis not present

## 2016-11-22 HISTORY — DX: Other chronic pain: G89.29

## 2016-11-22 HISTORY — DX: Low back pain, unspecified: M54.50

## 2016-11-22 HISTORY — DX: Headache, unspecified: R51.9

## 2016-11-22 MED ORDER — AMOXICILLIN-POT CLAVULANATE 875-125 MG PO TABS: 1.0000 | ORAL_TABLET | Freq: Two times a day (BID) | ORAL | 0 refills | Status: DC

## 2016-11-22 NOTE — Progress Notes (Signed)
Patient ID: Tony Paul, male   DOB: 12-27-1960, 56 y.o.   MRN: [id number]   Subjective:    Patient ID: Tony Paul, male    DOB: 02/03/1961, 56 y.o.   MRN: [id number]  Chief Complaint  Patient presents with  . Annual Exam  . Hyperlipidemia   I acted as a Education administrator for Dr. Charlett Blake. Princess, RMA  Hyperlipidemia  This is a recurrent problem. The current episode started more than 1 year ago. The problem is controlled. Pertinent negatives include no chest pain or shortness of breath.    Patient is in today for annual exam following up for hyperlipidemia and other medical concerns. He has been struggling with 3 weeks of head and chest congestion. Cough and nose are productive of green to yellowish mucus. No fevers, chills. He is following with Dr Elvera Lennox for his Rosacea. He is complaining of left heel pain. Has tried some stretching but no significant medications. This has restricted his activity. Has been trying to maintain a heart healthy diet. Denies CP/palp/SOB/HA/congestion/fevers/GI or GU c/o. Taking meds as prescribed  Past Medical History:  Diagnosis Date  . colon ca dx'd 08/2012  . Depression with anxiety 01/04/2013  . Dyspnea    on exertion  . Headache 11/22/2016  . Health maintenance examination   . Heart murmur   . Heel pain, chronic, left 11/22/2016  . HLD (hyperlipidemia)   . Low back pain 11/22/2016  . Nocturia 04/09/2013  . Restless sleeper 04/09/2013  . Rosacea   . Skin cancer   . 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 Viral URI with cough 11/22/2016    Past Surgical History:  Procedure Laterality Date  . COLON SURGERY  2013  . COLONOSCOPY    . LAPAROSCOPIC LOW ANTERIOR RESECTION  10/09/2012   Procedure: LAPAROSCOPIC LOW ANTERIOR RESECTION;  Surgeon: Leighton Ruff, MD;  Location: [facility];  Service: General;  Laterality: N/A;  . TONSILLECTOMY  1971    Family History  Problem Relation Age of Onset  . Heart failure Father   . Kidney disease Father     . Hypertension Other   . Hyperlipidemia Other   . Kidney disease Other   . Colon cancer Neg Hx   . Prostate cancer Neg Hx   . Esophageal cancer Neg Hx   . Rectal cancer Neg Hx   . Stomach cancer Neg Hx     Social History   Social History  . Marital status: Married    Spouse name: N/A  . Number of children: N/A  . Years of education: N/A   Occupational History  . Not on file.   Social History Main Topics  . Smoking status: Never Smoker  . Smokeless tobacco: Never Used  . Alcohol use 0.6 oz/week    1 Glasses of wine per week     Comment: some weeks none  . Drug use: No  . Sexual activity: Yes   Other Topics Concern  . Not on file   Social History Narrative   Charity fundraiser for [employer]   Married 30 years   3 sons 21, 25,29       Outpatient Medications Prior to Visit  Medication Sig Dispense Refill  . Ascorbic Acid (VITAMIN C PO) Take 1 tablet by mouth daily.    Marland Kitchen aspirin EC 81 MG tablet Take 81 mg by mouth daily. On occasions    . fish oil-omega-3 fatty acids 1000 MG capsule Take 3-4 g by mouth daily  as needed. On occasion    . metroNIDAZOLE (METROGEL) 0.75 % gel Apply 1 application topically daily.    . Multiple Vitamin (MULTIVITAMIN) tablet Take 1 tablet by mouth daily.    Marland Kitchen 0.9 %  sodium chloride infusion      No facility-administered medications prior to visit.     No Known Allergies  Review of Systems  Constitutional: Negative for fever and malaise/fatigue.  HENT: Positive for congestion.   Eyes: Negative for blurred vision.  Respiratory: Positive for cough and sputum production. Negative for shortness of breath.   Cardiovascular: Negative for chest pain, palpitations and leg swelling.  Gastrointestinal: Negative for vomiting.  Genitourinary: Positive for frequency and urgency.  Musculoskeletal: Positive for back pain and joint pain.  Skin: Positive for rash.  Neurological: Negative for loss of consciousness and headaches.       Objective:     Physical Exam  Constitutional: He is oriented to person, place, and time. He appears well-developed and well-nourished. No distress.  HENT:  Head: Normocephalic and atraumatic.  Eyes: Conjunctivae are normal.  Neck: Normal range of motion. No thyromegaly present.  Cardiovascular: Normal rate and regular rhythm.   Pulmonary/Chest: Effort normal and breath sounds normal. He has no wheezes.  Abdominal: Soft. Bowel sounds are normal. There is no tenderness.  Musculoskeletal: Normal range of motion. He exhibits no edema or deformity.  Neurological: He is alert and oriented to person, place, and time.  Skin: Skin is warm and dry. He is not diaphoretic.  Psychiatric: He has a normal mood and affect.    BP 118/82 (BP Location: Left Arm, Patient Position: Sitting, Cuff Size: Normal)   Pulse 78   Temp 98.1 F (36.7 C) (Oral)   Ht 5\' 10"  (1.778 m)   Wt 226 lb 6.4 oz (102.7 kg)   SpO2 96%   BMI 32.49 kg/m  Wt Readings from Last 3 Encounters:  11/22/16 226 lb 6.4 oz (102.7 kg)  09/17/16 218 lb (98.9 kg)  09/03/16 218 lb (98.9 kg)     Lab Results  Component Value Date   WBC 7.3 11/22/2016   HGB 14.2 11/22/2016   HCT 42.3 11/22/2016   PLT 247.0 11/22/2016   GLUCOSE 76 11/22/2016   CHOL 252 (H) 11/22/2016   TRIG 125.0 11/22/2016   HDL 39.20 11/22/2016   LDLCALC 188 (H) 11/22/2016   ALT 15 11/22/2016   AST 16 11/22/2016   NA 140 11/22/2016   K 4.2 11/22/2016   CL 102 11/22/2016   CREATININE 0.95 11/22/2016   BUN 16 11/22/2016   CO2 31 11/22/2016   TSH 1.04 11/22/2016   PSA 0.65 11/22/2016   HGBA1C 5.6 07/17/2012    Lab Results  Component Value Date   TSH 1.04 11/22/2016   Lab Results  Component Value Date   WBC 7.3 11/22/2016   HGB 14.2 11/22/2016   HCT 42.3 11/22/2016   MCV 87.7 11/22/2016   PLT 247.0 11/22/2016   Lab Results  Component Value Date   NA 140 11/22/2016   K 4.2 11/22/2016   CO2 31 11/22/2016   GLUCOSE 76 11/22/2016   BUN 16 11/22/2016    CREATININE 0.95 11/22/2016   BILITOT 1.0 11/22/2016   ALKPHOS 66 11/22/2016   AST 16 11/22/2016   ALT 15 11/22/2016   PROT 6.9 11/22/2016   ALBUMIN 4.8 11/22/2016   CALCIUM 9.7 11/22/2016   GFR 87.33 11/22/2016   Lab Results  Component Value Date   CHOL 252 (H) 11/22/2016  Lab Results  Component Value Date   HDL 39.20 11/22/2016   Lab Results  Component Value Date   LDLCALC 188 (H) 11/22/2016   Lab Results  Component Value Date   TRIG 125.0 11/22/2016   Lab Results  Component Value Date   CHOLHDL 6 11/22/2016   Lab Results  Component Value Date   HGBA1C 5.6 07/17/2012       Assessment & Plan:   Problem List Items Addressed This Visit    Hyperlipidemia - Primary   Relevant Orders   Lipid panel (Completed)   Annual physical exam   Relevant Orders   CBC (Completed)   Comprehensive metabolic panel (Completed)   Lipid panel (Completed)   TSH (Completed)   PSA (Completed)   Colon cancer ([place])    Is doing well with bowels moving regularly. Will continue to follow closely with gastroenterology.       Relevant Medications   amoxicillin-clavulanate (AUGMENTIN) 875-125 MG tablet   Nocturia    Worsening. Referred to urology and check UA and psa      Relevant Orders   PSA (Completed)   Urinalysis   Skin cancer   Relevant Medications   amoxicillin-clavulanate (AUGMENTIN) 875-125 MG tablet   Other Relevant Orders   CBC (Completed)   Comprehensive metabolic panel (Completed)   TSH (Completed)   Low back pain    Intermittent, OTC pain meds such as Advil prn with good results.       Heel pain, chronic, left    Plantar fasciitis, encouraged stretching, ice, topical treatments and inserts if no improvement will refer to podiatry      Viral URI with cough    Encouraged increased rest and hydration, add probiotics, zinc such as Coldeze or Xicam. Treat fevers as needed. If hi fevers occur, symptoms do not resolve or worsen is given a prescription for augmentin  to try      Headache    Other Visit Diagnoses    Urinary frequency       Relevant Orders   Ambulatory referral to Urology      I am having Mr. Maskell start on amoxicillin-clavulanate. I am also having him maintain his aspirin EC, fish oil-omega-3 fatty acids, multivitamin, metroNIDAZOLE, and Ascorbic Acid (VITAMIN C PO). We will stop administering sodium chloride.  Meds ordered this encounter  Medications  . amoxicillin-clavulanate (AUGMENTIN) 875-125 MG tablet    Sig: Take 1 tablet by mouth 2 (two) times daily.    Dispense:  20 tablet    Refill:  0    CMA served as scribe during this visit. History, Physical and Plan performed by medical provider. Documentation and orders reviewed and attested to.  Penni Homans, MD

## 2016-11-22 NOTE — Assessment & Plan Note (Addendum)
Is doing well with bowels moving regularly. Will continue to follow closely with gastroenterology.

## 2016-11-22 NOTE — Assessment & Plan Note (Signed)
Worsening. Referred to urology and check UA and psa

## 2016-11-22 NOTE — Assessment & Plan Note (Addendum)
Encouraged increased rest and hydration, add probiotics, zinc such as Coldeze or Xicam. Treat fevers as needed. If hi fevers occur, symptoms do not resolve or worsen is given a prescription for augmentin to try

## 2016-11-22 NOTE — Assessment & Plan Note (Signed)
Intermittent, OTC pain meds such as Advil prn with good results.

## 2016-11-22 NOTE — Patient Instructions (Addendum)
Aspercreme lidocaine patches, or roll-ons will help with pain  Wear a wide brim hat when in the sun.  Mucinex plain 2 times daily Elderberry twice daily Aged [employer] Garlic twice daily Vitamin C 500-102m daily 64 ounces of water Probiotic "NOW" SParmeleeYears, Male Preventive care refers to lifestyle choices and visits with your health care provider that can promote health and wellness. What does preventive care include?  A yearly physical exam. This is also called an annual well check.  Dental exams once or twice a year.  Routine eye exams. Ask your health care provider how often you should have your eyes checked.  Personal lifestyle choices, including:  Daily care of your teeth and gums.  Regular physical activity.  Eating a healthy diet.  Avoiding tobacco and drug use.  Limiting alcohol use.  Practicing safe sex.  Taking low-dose aspirin every day starting at age 56 What happens during an annual well check? The services and screenings done by your health care provider during your annual well check will depend on your age, overall health, lifestyle risk factors, and family history of disease. Counseling  Your health care provider may ask you questions about your:  Alcohol use.  Tobacco use.  Drug use.  Emotional well-being.  Home and relationship well-being.  Sexual activity.  Eating habits.  Work and work eStatistician Screening  You may have the following tests or measurements:  Height, weight, and BMI.  Blood pressure.  Lipid and cholesterol levels. These may be checked every 5 years, or more frequently if you are over 56years old.  Skin check.  Lung cancer screening. You may have this screening every year starting at age 56if you have a 30-pack-year history of smoking and currently smoke or have quit within the past 15 years.  Fecal occult blood test (FOBT) of the stool. You may have this test every year  starting at age 56  Flexible sigmoidoscopy or colonoscopy. You may have a sigmoidoscopy every 5 years or a colonoscopy every 10 years starting at age 56  Prostate cancer screening. Recommendations will vary depending on your family history and other risks.  Hepatitis C blood test.  Hepatitis B blood test.  Sexually transmitted disease (STD) testing.  Diabetes screening. This is done by checking your blood sugar (glucose) after you have not eaten for a while (fasting). You may have this done every 1-3 years. Discuss your test results, treatment options, and if necessary, the need for more tests with your health care provider. Vaccines  Your health care provider may recommend certain vaccines, such as:  Influenza vaccine. This is recommended every year.  Tetanus, diphtheria, and acellular pertussis (Tdap, Td) vaccine. You may need a Td booster every 10 years.  Varicella vaccine. You may need this if you have not been vaccinated.  Zoster vaccine. You may need this after age 56  Measles, mumps, and rubella (MMR) vaccine. You may need at least one dose of MMR if you were born in 1957 or later. You may also need a second dose.  Pneumococcal 13-valent conjugate (PCV13) vaccine. You may need this if you have certain conditions and have not been vaccinated.  Pneumococcal polysaccharide (PPSV23) vaccine. You may need one or two doses if you smoke cigarettes or if you have certain conditions.  Meningococcal vaccine. You may need this if you have certain conditions.  Hepatitis A vaccine. You may need this if you have certain conditions or if you  travel or work in places where you may be exposed to hepatitis A.  Hepatitis B vaccine. You may need this if you have certain conditions or if you travel or work in places where you may be exposed to hepatitis B.  Haemophilus influenzae type b (Hib) vaccine. You may need this if you have certain risk factors. Talk to your health care provider  about which screenings and vaccines you need and how often you need them. This information is not intended to replace advice given to you by your health care provider. Make sure you discuss any questions you have with your health care provider. Document Released: 11/04/2015 Document Revised: 06/27/2016 Document Reviewed: 08/09/2015 Elsevier Interactive Patient Education  2017 [employer].

## 2016-11-22 NOTE — Progress Notes (Signed)
Pre visit review using our clinic review tool, if applicable. No additional management support is needed unless otherwise documented below in the visit note. 

## 2016-11-23 LAB — CBC
HEMATOCRIT: 42.3 % (ref 39.0–52.0)
Hemoglobin: 14.2 g/dL (ref 13.0–17.0)
MCHC: 33.7 g/dL (ref 30.0–36.0)
MCV: 87.7 fl (ref 78.0–100.0)
Platelets: 247 10*3/uL (ref 150.0–400.0)
RBC: 4.82 Mil/uL (ref 4.22–5.81)
RDW: 13.4 % (ref 11.5–15.5)
WBC: 7.3 10*3/uL (ref 4.0–10.5)

## 2016-11-23 LAB — COMPREHENSIVE METABOLIC PANEL
ALBUMIN: 4.8 g/dL (ref 3.5–5.2)
ALT: 15 U/L (ref 0–53)
AST: 16 U/L (ref 0–37)
Alkaline Phosphatase: 66 U/L (ref 39–117)
BUN: 16 mg/dL (ref 6–23)
CHLORIDE: 102 meq/L (ref 96–112)
CO2: 31 meq/L (ref 19–32)
CREATININE: 0.95 mg/dL (ref 0.40–1.50)
Calcium: 9.7 mg/dL (ref 8.4–10.5)
GFR: 87.33 mL/min (ref >60.00)
Glucose, Bld: 76 mg/dL (ref 70–99)
POTASSIUM: 4.2 meq/L (ref 3.5–5.1)
SODIUM: 140 meq/L (ref 135–145)
Total Bilirubin: 1 mg/dL (ref 0.2–1.2)
Total Protein: 6.9 g/dL (ref 6.0–8.3)

## 2016-11-23 LAB — LIPID PANEL
CHOL/HDL RATIO: 6
CHOLESTEROL: 252 mg/dL — AB (ref 0–200)
HDL: 39.2 mg/dL (ref >39.00)
LDL Cholesterol: 188 mg/dL — ABNORMAL HIGH (ref 0–99)
NonHDL: 213.2
TRIGLYCERIDES: 125 mg/dL (ref 0.0–149.0)
VLDL: 25 mg/dL (ref 0.0–40.0)

## 2016-11-23 LAB — URINALYSIS, ROUTINE W REFLEX MICROSCOPIC
Bilirubin Urine: NEGATIVE
Ketones, ur: NEGATIVE
LEUKOCYTES UA: NEGATIVE
Nitrite: NEGATIVE
TOTAL PROTEIN, URINE-UPE24: NEGATIVE
Urine Glucose: NEGATIVE
Urobilinogen, UA: 0.2 (ref 0.0–1.0)
WBC, UA: NONE SEEN (ref 0–?)
pH: 5.5 (ref 5.0–8.0)

## 2016-11-23 LAB — PSA: PSA: 0.65 ng/mL (ref 0.10–4.00)

## 2016-11-23 LAB — TSH: TSH: 1.04 u[IU]/mL (ref 0.35–4.50)

## 2016-11-25 NOTE — Assessment & Plan Note (Signed)
Plantar fasciitis, encouraged stretching, ice, topical treatments and inserts if no improvement will refer to podiatry

## 2016-11-26 ENCOUNTER — Telehealth: Payer: Self-pay | Admitting: *Deleted

## 2016-11-26 ENCOUNTER — Other Ambulatory Visit: Payer: Self-pay | Admitting: Family Medicine

## 2016-11-26 DIAGNOSIS — E785 Hyperlipidemia, unspecified: Secondary | ICD-10-CM

## 2016-11-26 MED ORDER — ATORVASTATIN CALCIUM 10 MG PO TABS: 10.0000 mg | ORAL_TABLET | Freq: Every day | ORAL | 3 refills | Status: DC

## 2016-11-26 MED FILL — ATORVASTATIN 10 MG TABLET: 10 | 30 days supply | Qty: 30 | Fill #0

## 2016-11-26 NOTE — Telephone Encounter (Signed)
Message from pt to confirm 2/26 appt is needed. Also wants to be sure labs aren't duplicated. CEA ordered for 2/26. Left message informing him office visit is on schedule. CEA has not been done by other offices.

## 2016-11-27 ENCOUNTER — Telehealth: Payer: Self-pay | Admitting: Family Medicine

## 2016-11-27 MED ORDER — AMOXICILLIN-POT CLAVULANATE 875-125 MG PO TABS: 1.0000 | ORAL_TABLET | Freq: Two times a day (BID) | ORAL | 0 refills | Status: DC

## 2016-11-27 MED FILL — AMOX-CLAV 875-125 MG TABLET: 875-125 | 10 days supply | Qty: 20 | Fill #0

## 2016-11-27 NOTE — Telephone Encounter (Signed)
Sent in amoxicillin to the [facility]. Called both numbers in message left a detailed message for both patient/and his wife that prescription sent in.

## 2016-11-27 NOTE — Telephone Encounter (Signed)
Caller name: Butch Penny Relation to pt: spouse  Call back number: [phone] or tel [phone] pt's [place]  Reason for call: Pt's wife states pt was see in our office 11-22-2016 for CPE and that he was given a printed out prescription for Amoxiciline (pt's wife states not sure of the name of prescription), pt's wife states can not fine the printed out prescription and would like to know if it can be sent to Pharmacy at [place]. Please advise.

## 2016-11-30 ENCOUNTER — Encounter: Payer: Self-pay | Admitting: Family Medicine

## 2016-12-17 ENCOUNTER — Other Ambulatory Visit (HOSPITAL_BASED_OUTPATIENT_CLINIC_OR_DEPARTMENT_OTHER): Payer: BLUE CROSS/BLUE SHIELD

## 2016-12-17 ENCOUNTER — Ambulatory Visit (HOSPITAL_BASED_OUTPATIENT_CLINIC_OR_DEPARTMENT_OTHER): Payer: BLUE CROSS/BLUE SHIELD | Admitting: Oncology

## 2016-12-17 ENCOUNTER — Telehealth: Payer: Self-pay | Admitting: Oncology

## 2016-12-17 VITALS — BP 129/91 | HR 64 | Temp 98.0°F | Resp 19 | Wt 225.4 lb

## 2016-12-17 DIAGNOSIS — Z85038 Personal history of other malignant neoplasm of large intestine: Secondary | ICD-10-CM | POA: Diagnosis not present

## 2016-12-17 DIAGNOSIS — R351 Nocturia: Secondary | ICD-10-CM

## 2016-12-17 DIAGNOSIS — C187 Malignant neoplasm of sigmoid colon: Secondary | ICD-10-CM | POA: Diagnosis not present

## 2016-12-17 DIAGNOSIS — Z85820 Personal history of malignant melanoma of skin: Secondary | ICD-10-CM | POA: Diagnosis not present

## 2016-12-17 DIAGNOSIS — R3911 Hesitancy of micturition: Secondary | ICD-10-CM

## 2016-12-17 NOTE — Telephone Encounter (Signed)
Patient prefers lab to be drawn at [facility]. (scheduled for 09/23/17)  Appointments scheduled per 12/17/16 los. Patient was given a copy of the AVS report and appointment schedule per 12/17/16 los.

## 2016-12-17 NOTE — Progress Notes (Signed)
  [facility] OFFICE PROGRESS NOTE   Diagnosis: Lung cancer INTERVAL HISTORY:   [name] returns as scheduled. He feels well. No difficulty with bowel function. He has nocturia and urinary hesitancy. He has been referred to a urologist. He underwent a surveillance colonoscopy 09/17/2016. A 6 mm polyp was removed from the transverse colon. The pathology revealed a tubular adenoma. He continues follow-up with dermatology after being diagnosed with melanoma.  Objective:  Vital signs in last 24 hours:  Blood pressure (!) 129/91, pulse 64, temperature 98 F (36.7 C), temperature source Oral, resp. rate 19, weight 225 lb 6.4 oz (102.2 kg), SpO2 99 %.    HEENT: Neck without mass Lymphatics: No cervical, supraclavicular, axillary, or inguinal nodes Resp: Lungs clear bilaterally Cardio: Regular rate and rhythm GI: No hepatosplenomegaly, no mass Vascular: No leg edema  Skin: Right leg scar without evidence of recurrent tumor   Portacath/PICC-without erythema  Lab Results:  CEA-pending  Medications: I have reviewed the patient's current medications.  Assessment/Plan: 1.Adenocarcinoma of the sigmoid colon, stage II (T3 N0), status post a low anterior resection 10/09/2012, microsatellite stable with no loss of mismatch repair protein expression   Negative surveillance colonoscopy 09/08/2013  Surveillance colonoscopy 09/17/2016-tubular adenoma removed from the transverse colon 2. 6 mm indeterminate liver lesion on a CT of the abdomen 10/24/2012   Stable on a CT 05/26/2013, felt to represent a cyst-no further imaging recommended 3. Rosacea  4. 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 We will follow-up on the CEA from today.  He will return for an office visit and CEA in December 2018.  He will continue follow-up with dermatology after being diagnosed with a melanoma.  15 minutes were spent with the patient today. The majority of the time was used for counseling and coordination of care.  Betsy Coder, MD  12/17/2016  3:41 PM

## 2016-12-18 ENCOUNTER — Telehealth: Payer: Self-pay

## 2016-12-18 LAB — CEA (IN HOUSE-CHCC): CEA (CHCC-In House): 1.32 ng/mL (ref 0.00–5.00)

## 2016-12-18 LAB — CEA (PARALLEL TESTING): CEA: <0.5 ng/mL

## 2016-12-18 NOTE — Telephone Encounter (Signed)
-----   Message from Ladell Pier, MD sent at 12/18/2016  1:45 PM EST ----- Please call patient, Tony Paul is normal

## 2016-12-18 NOTE — Telephone Encounter (Signed)
Left message for pt to call back regarding lab results. 

## 2016-12-18 NOTE — Telephone Encounter (Signed)
Pt returned call, informed him of normal CEA. He voiced understanding.

## 2016-12-26 MED FILL — ATORVASTATIN 10 MG TABLET: 10 | 30 days supply | Qty: 30 | Fill #1

## 2016-12-27 ENCOUNTER — Other Ambulatory Visit: Payer: Self-pay | Admitting: Family Medicine

## 2016-12-27 ENCOUNTER — Encounter: Payer: Self-pay | Admitting: Family Medicine

## 2016-12-27 DIAGNOSIS — R0981 Nasal congestion: Secondary | ICD-10-CM

## 2016-12-27 DIAGNOSIS — J029 Acute pharyngitis, unspecified: Secondary | ICD-10-CM

## 2016-12-27 DIAGNOSIS — R5383 Other fatigue: Secondary | ICD-10-CM

## 2016-12-28 DIAGNOSIS — N401 Enlarged prostate with lower urinary tract symptoms: Secondary | ICD-10-CM | POA: Diagnosis not present

## 2016-12-28 DIAGNOSIS — Z6832 Body mass index (BMI) 32.0-32.9, adult: Secondary | ICD-10-CM | POA: Diagnosis not present

## 2016-12-28 DIAGNOSIS — N138 Other obstructive and reflux uropathy: Secondary | ICD-10-CM | POA: Diagnosis not present

## 2016-12-28 DIAGNOSIS — R350 Frequency of micturition: Secondary | ICD-10-CM | POA: Diagnosis not present

## 2016-12-28 MED FILL — ALFUZOSIN HCL ER 10 MG TAB: 10 | 30 days supply | Qty: 30 | Fill #0

## 2017-01-02 DIAGNOSIS — R5383 Other fatigue: Secondary | ICD-10-CM | POA: Diagnosis not present

## 2017-01-02 DIAGNOSIS — J029 Acute pharyngitis, unspecified: Secondary | ICD-10-CM | POA: Diagnosis not present

## 2017-01-02 DIAGNOSIS — J019 Acute sinusitis, unspecified: Secondary | ICD-10-CM | POA: Diagnosis not present

## 2017-01-02 MED FILL — DOXYCYCLINE HYCLATE 100 MG: 100 | 10 days supply | Qty: 20 | Fill #0

## 2017-01-10 DIAGNOSIS — J019 Acute sinusitis, unspecified: Secondary | ICD-10-CM | POA: Diagnosis not present

## 2017-01-10 DIAGNOSIS — R05 Cough: Secondary | ICD-10-CM | POA: Diagnosis not present

## 2017-01-10 DIAGNOSIS — R51 Headache: Secondary | ICD-10-CM | POA: Diagnosis not present

## 2017-01-10 DIAGNOSIS — R0683 Snoring: Secondary | ICD-10-CM | POA: Diagnosis not present

## 2017-01-10 MED FILL — VENTOLIN HFA 90 MCG INHALER: 108 (90 BAS | 30 days supply | Qty: 18 | Fill #0

## 2017-01-10 MED FILL — FLUTICASONE PROP 50 MCG SPR: 50 | 30 days supply | Qty: 16 | Fill #0

## 2017-01-10 MED FILL — DOXYCYCLINE HYCLATE 100 MG: 100 | 10 days supply | Qty: 20 | Fill #0

## 2017-01-10 MED FILL — predniSONE 10 MG TABS: 10 | 10 days supply | Qty: 10 | Fill #0

## 2017-01-10 MED FILL — OMEPRAZOLE DR 40 MG CAPSULE: 40 | 30 days supply | Qty: 30 | Fill #0

## 2017-01-10 MED FILL — SUMATRIPTAN SUCC 100 MG TAB: 100 | 30 days supply | Qty: 9 | Fill #0

## 2017-01-30 DIAGNOSIS — N401 Enlarged prostate with lower urinary tract symptoms: Secondary | ICD-10-CM | POA: Diagnosis not present

## 2017-01-30 DIAGNOSIS — N138 Other obstructive and reflux uropathy: Secondary | ICD-10-CM | POA: Diagnosis not present

## 2017-01-30 DIAGNOSIS — R350 Frequency of micturition: Secondary | ICD-10-CM | POA: Diagnosis not present

## 2017-02-04 MED FILL — ALFUZOSIN HCL ER 10 MG TAB: 10 | 30 days supply | Qty: 30 | Fill #1

## 2017-02-04 MED FILL — ATORVASTATIN 10 MG TABLET: 10 | 30 days supply | Qty: 30 | Fill #2

## 2017-02-27 ENCOUNTER — Other Ambulatory Visit (INDEPENDENT_AMBULATORY_CARE_PROVIDER_SITE_OTHER): Payer: BLUE CROSS/BLUE SHIELD

## 2017-02-27 DIAGNOSIS — E785 Hyperlipidemia, unspecified: Secondary | ICD-10-CM | POA: Diagnosis not present

## 2017-02-28 LAB — COMPREHENSIVE METABOLIC PANEL
ALK PHOS: 65 U/L (ref 39–117)
ALT: 13 U/L (ref 0–53)
AST: 15 U/L (ref 0–37)
Albumin: 4.5 g/dL (ref 3.5–5.2)
BUN: 16 mg/dL (ref 6–23)
CO2: 27 meq/L (ref 19–32)
Calcium: 9.1 mg/dL (ref 8.4–10.5)
Chloride: 107 mEq/L (ref 96–112)
Creatinine, Ser: 1.09 mg/dL (ref 0.40–1.50)
GFR: 74.45 mL/min (ref >60.00)
GLUCOSE: 96 mg/dL (ref 70–99)
POTASSIUM: 3.9 meq/L (ref 3.5–5.1)
SODIUM: 141 meq/L (ref 135–145)
TOTAL PROTEIN: 6.5 g/dL (ref 6.0–8.3)
Total Bilirubin: 0.6 mg/dL (ref 0.2–1.2)

## 2017-02-28 LAB — LIPID PANEL
Cholesterol: 139 mg/dL (ref 0–200)
HDL: 37.2 mg/dL — AB (ref >39.00)
LDL Cholesterol: 70 mg/dL (ref 0–99)
NONHDL: 101.77
Total CHOL/HDL Ratio: 4
Triglycerides: 158 mg/dL — ABNORMAL HIGH (ref 0.0–149.0)
VLDL: 31.6 mg/dL (ref 0.0–40.0)

## 2017-03-04 MED FILL — ALFUZOSIN HCL ER 10 MG TAB: 10 | 30 days supply | Qty: 30 | Fill #2

## 2017-03-04 MED FILL — ATORVASTATIN 10 MG TABLET: 10 | 30 days supply | Qty: 30 | Fill #3

## 2017-04-05 ENCOUNTER — Other Ambulatory Visit: Payer: Self-pay | Admitting: Family Medicine

## 2017-04-05 MED FILL — ATORVASTATIN 10 MG TABLET: 10 | 30 days supply | Qty: 30 | Fill #0

## 2017-04-05 MED FILL — ALFUZOSIN HCL ER 10 MG TAB: 10 | 30 days supply | Qty: 30 | Fill #3

## 2017-04-09 MED FILL — metroNIDAZOLE 0.75 % CREA: 0.75 | 30 days supply | Qty: 45 | Fill #0

## 2017-05-17 MED FILL — ATORVASTATIN 10 MG TABLET: 10 | 30 days supply | Qty: 30 | Fill #1

## 2017-05-17 MED FILL — ALFUZOSIN HCL ER 10 MG TAB: 10 | 30 days supply | Qty: 30 | Fill #4

## 2017-07-02 MED FILL — ALFUZOSIN HCL ER 10 MG TAB: 10 | 30 days supply | Qty: 30 | Fill #5

## 2017-07-02 MED FILL — ATORVASTATIN 10 MG TABLET: 10 | 30 days supply | Qty: 30 | Fill #2

## 2017-07-04 DIAGNOSIS — K644 Residual hemorrhoidal skin tags: Secondary | ICD-10-CM | POA: Diagnosis not present

## 2017-07-04 DIAGNOSIS — K648 Other hemorrhoids: Secondary | ICD-10-CM | POA: Diagnosis not present

## 2017-08-16 MED FILL — ATORVASTATIN 10 MG TABLET: 10 | 30 days supply | Qty: 30 | Fill #3

## 2017-08-16 MED FILL — ALFUZOSIN HCL ER 10 MG TAB: 10 | 30 days supply | Qty: 30 | Fill #6

## 2017-09-23 ENCOUNTER — Other Ambulatory Visit: Payer: BLUE CROSS/BLUE SHIELD

## 2017-09-26 ENCOUNTER — Other Ambulatory Visit: Payer: Self-pay | Admitting: *Deleted

## 2017-09-26 DIAGNOSIS — C187 Malignant neoplasm of sigmoid colon: Secondary | ICD-10-CM

## 2017-09-27 ENCOUNTER — Other Ambulatory Visit: Payer: BLUE CROSS/BLUE SHIELD

## 2017-09-30 ENCOUNTER — Ambulatory Visit: Payer: Self-pay | Admitting: Oncology

## 2017-10-11 ENCOUNTER — Other Ambulatory Visit: Payer: Self-pay

## 2017-10-14 ENCOUNTER — Telehealth: Payer: Self-pay | Admitting: Oncology

## 2017-10-14 NOTE — Telephone Encounter (Signed)
Scheduled appt per 12/20 sch message  - patient is aware of appt date and time.

## 2017-10-16 ENCOUNTER — Other Ambulatory Visit (HOSPITAL_BASED_OUTPATIENT_CLINIC_OR_DEPARTMENT_OTHER): Payer: Self-pay

## 2017-10-16 ENCOUNTER — Ambulatory Visit (HOSPITAL_BASED_OUTPATIENT_CLINIC_OR_DEPARTMENT_OTHER): Payer: Self-pay | Admitting: Nurse Practitioner

## 2017-10-16 ENCOUNTER — Telehealth: Payer: Self-pay | Admitting: Nurse Practitioner

## 2017-10-16 ENCOUNTER — Encounter: Payer: Self-pay | Admitting: Nurse Practitioner

## 2017-10-16 VITALS — BP 134/85 | HR 64 | Temp 98.0°F | Resp 20 | Ht 70.0 in | Wt 219.0 lb

## 2017-10-16 DIAGNOSIS — C187 Malignant neoplasm of sigmoid colon: Secondary | ICD-10-CM

## 2017-10-16 DIAGNOSIS — Z85038 Personal history of other malignant neoplasm of large intestine: Secondary | ICD-10-CM

## 2017-10-16 DIAGNOSIS — Z85820 Personal history of malignant melanoma of skin: Secondary | ICD-10-CM

## 2017-10-16 LAB — CEA (IN HOUSE-CHCC): CEA (CHCC-In House): 1.52 ng/mL (ref 0.00–5.00)

## 2017-10-16 NOTE — Progress Notes (Addendum)
  [facility] OFFICE PROGRESS NOTE   Diagnosis: Colon cancer  INTERVAL HISTORY:   Mr. Edgell returns as scheduled.  No change in bowel habits.  No bloody or black stools.  No abdominal pain.  He has a good appetite.  Objective:  Vital signs in last 24 hours:  Blood pressure 134/85, pulse 64, temperature 98 F (36.7 C), temperature source Oral, resp. rate 20, height '5\' 10"'$  (1.778 m), weight 219 lb (99.3 kg), SpO2 100 %.    HEENT: Neck without mass. Lymphatics: No palpable cervical, supraclavicular, axillary or inguinal lymph nodes. Resp: Lungs clear bilaterally. Cardio: Regular rate and rhythm. GI: Abdomen soft and nontender.  No hepatomegaly. Vascular: No leg edema.  Skin: Right leg scar without evidence of recurrent tumor.   Lab Results:  Lab Results  Component Value Date   WBC 7.3 11/22/2016   HGB 14.2 11/22/2016   HCT 42.3 11/22/2016   MCV 87.7 11/22/2016   PLT 247.0 11/22/2016   NEUTROABS 3.7 10/03/2012    Imaging:  No results found.  Medications: I have reviewed the patient's current medications.  Assessment/Plan: 1.Adenocarcinoma of the sigmoid colon, stage II (T3 N0), status post a low anterior resection 10/09/2012, microsatellite stable with no loss of mismatch repair protein expression   Negative surveillance colonoscopy 09/08/2013  Surveillance colonoscopy 09/17/2016-tubular adenoma removed from the transverse colon 2. 6 mm indeterminate liver lesion on a CT of the abdomen 10/24/2012   Stable on a CT 05/26/2013, felt to represent a cyst-no further imaging recommended 3. Rosacea  4. 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  We will follow-up on the CEA from today.  He is now 5 years out from  the initial diagnosis.  We did not schedule formal follow-up in our office.  He will continue colonoscopy follow-up with Dr. Hilarie Fredrickson.  He will continue follow-up with dermatology regarding the melanoma.  He understands he can contact our office at any time with problems, questions or concerns.  The influenza vaccine was administered at today's visit.  Patient seen with Dr. Benay Spice.    Ned Card ANP/GNP-BC   10/16/2017  9:35 AM  This was a shared visit with Ned Card.  Tony Paul remains in clinical remission from colon cancer.  He has a good prognosis for a long-term disease-free survival.  He plans to continue colonoscopy follow-up with Dr. Hilarie Fredrickson and clinical follow-up with Dr. Charlett Blake.  He sees dermatology for follow-up of melanoma.  He is not scheduled for a follow-up appointment at the Cancer center.  We are available to see him in the future as needed.  Julieanne Manson, MD

## 2017-10-16 NOTE — Telephone Encounter (Signed)
No 12/26 los.  

## 2017-11-08 ENCOUNTER — Other Ambulatory Visit: Payer: Self-pay | Admitting: Family Medicine

## 2017-11-08 MED FILL — ALFUZOSIN HCL ER 10 MG TAB: 10 | 30 days supply | Qty: 30 | Fill #7

## 2017-11-11 ENCOUNTER — Telehealth: Payer: Self-pay | Admitting: Family Medicine

## 2017-11-11 NOTE — Telephone Encounter (Signed)
Copied from [place] [id number]. Topic: General - Other >> Nov 11, 2017  4:24 PM Neva Seat wrote: Atorvastatin ? - Cholesterol Rx  Pt is needing this refilled.  [place] [place] [address] Phone: [phone] Fax: [phone]

## 2017-11-11 NOTE — Telephone Encounter (Signed)
Patient called at [phone] to notify the atorvastatin prescription was sent to [facility] on 11/08/17 and is ready for pickup.

## 2017-11-25 ENCOUNTER — Encounter: Payer: BLUE CROSS/BLUE SHIELD | Admitting: Family Medicine

## 2017-11-25 MED FILL — ATORVASTATIN 10 MG TABLET: 10 | 30 days supply | Qty: 30 | Fill #0

## 2017-12-03 ENCOUNTER — Encounter: Payer: BLUE CROSS/BLUE SHIELD | Admitting: Family Medicine

## 2017-12-30 ENCOUNTER — Encounter: Payer: Self-pay | Admitting: Family Medicine

## 2018-02-03 ENCOUNTER — Telehealth: Payer: Self-pay | Admitting: Family Medicine

## 2018-02-03 NOTE — Telephone Encounter (Signed)
Copied from [place] [id number]. Topic: Quick Communication - Rx Refill/Question >> Feb 03, 2018  9:34 AM Margot Ables wrote: Pt spouse called stating they are going on a cruise 03/05/18 and would like Dr. Charlett Blake to order the sick sick patches that go behind the ear.   [place]

## 2018-02-03 NOTE — Telephone Encounter (Signed)
Please advise 

## 2018-02-03 NOTE — Telephone Encounter (Signed)
Scopolamine patch 1 mg/3 days apply behind ear. Change every 3 days. Disp QS for # of days

## 2018-02-05 MED ORDER — SCOPOLAMINE 1 MG/3DAYS TD PT72: MEDICATED_PATCH | TRANSDERMAL | 0 refills | Status: DC

## 2018-02-05 MED FILL — TRANSDERM-SCOP 1.5 MG/3 DAY: 1 | 2 days supply | Qty: 2 | Fill #0

## 2018-02-05 NOTE — Telephone Encounter (Signed)
Pt's spouse called and stated cruise would be 4 days. Rx sent, notified pt.

## 2018-02-06 MED FILL — ATORVASTATIN 10 MG TABLET: 10 | 30 days supply | Qty: 30 | Fill #1

## 2018-02-06 MED FILL — ALFUZOSIN HCL ER 10 MG TB24: 10 | 30 days supply | Qty: 30 | Fill #0

## 2018-03-24 ENCOUNTER — Encounter: Payer: Self-pay | Admitting: Family Medicine

## 2018-03-24 ENCOUNTER — Ambulatory Visit (INDEPENDENT_AMBULATORY_CARE_PROVIDER_SITE_OTHER): Payer: BLUE CROSS/BLUE SHIELD | Admitting: Family Medicine

## 2018-03-24 VITALS — BP 120/82 | HR 75 | Temp 97.8°F | Resp 18 | Ht 70.0 in | Wt 219.0 lb

## 2018-03-24 DIAGNOSIS — R739 Hyperglycemia, unspecified: Secondary | ICD-10-CM

## 2018-03-24 DIAGNOSIS — E785 Hyperlipidemia, unspecified: Secondary | ICD-10-CM

## 2018-03-24 DIAGNOSIS — R350 Frequency of micturition: Secondary | ICD-10-CM

## 2018-03-24 DIAGNOSIS — E669 Obesity, unspecified: Secondary | ICD-10-CM

## 2018-03-24 DIAGNOSIS — J329 Chronic sinusitis, unspecified: Secondary | ICD-10-CM

## 2018-03-24 DIAGNOSIS — Z0000 Encounter for general adult medical examination without abnormal findings: Secondary | ICD-10-CM | POA: Diagnosis not present

## 2018-03-24 DIAGNOSIS — M1990 Unspecified osteoarthritis, unspecified site: Secondary | ICD-10-CM | POA: Diagnosis not present

## 2018-03-24 LAB — COMPREHENSIVE METABOLIC PANEL
ALT: 12 U/L (ref 0–53)
AST: 15 U/L (ref 0–37)
Albumin: 4.8 g/dL (ref 3.5–5.2)
Alkaline Phosphatase: 67 U/L (ref 39–117)
BUN: 12 mg/dL (ref 6–23)
CO2: 32 meq/L (ref 19–32)
Calcium: 9.7 mg/dL (ref 8.4–10.5)
Chloride: 104 mEq/L (ref 96–112)
Creatinine, Ser: 0.96 mg/dL (ref 0.40–1.50)
GFR: 85.87 mL/min (ref >60.00)
Glucose, Bld: 96 mg/dL (ref 70–99)
POTASSIUM: 5 meq/L (ref 3.5–5.1)
SODIUM: 141 meq/L (ref 135–145)
Total Bilirubin: 0.9 mg/dL (ref 0.2–1.2)
Total Protein: 6.8 g/dL (ref 6.0–8.3)

## 2018-03-24 LAB — HEMOGLOBIN A1C: HEMOGLOBIN A1C: 5.5 % (ref 4.6–6.5)

## 2018-03-24 LAB — URINALYSIS
Bilirubin Urine: NEGATIVE
Hgb urine dipstick: NEGATIVE
Ketones, ur: NEGATIVE
LEUKOCYTES UA: NEGATIVE
NITRITE: NEGATIVE
SPECIFIC GRAVITY, URINE: 1.015 (ref 1.000–1.030)
Total Protein, Urine: NEGATIVE
UROBILINOGEN UA: 0.2 (ref 0.0–1.0)
Urine Glucose: NEGATIVE
pH: 6.5 (ref 5.0–8.0)

## 2018-03-24 LAB — CBC
HEMATOCRIT: 44.4 % (ref 39.0–52.0)
Hemoglobin: 15.2 g/dL (ref 13.0–17.0)
MCHC: 34.2 g/dL (ref 30.0–36.0)
MCV: 88.4 fl (ref 78.0–100.0)
Platelets: 209 10*3/uL (ref 150.0–400.0)
RBC: 5.02 Mil/uL (ref 4.22–5.81)
RDW: 13.3 % (ref 11.5–15.5)
WBC: 5.4 10*3/uL (ref 4.0–10.5)

## 2018-03-24 LAB — PSA: PSA: 0.62 ng/mL (ref 0.10–4.00)

## 2018-03-24 LAB — TSH: TSH: 1.48 u[IU]/mL (ref 0.35–4.50)

## 2018-03-24 NOTE — Assessment & Plan Note (Signed)
Encouraged heart healthy diet, increase exercise, avoid trans fats, consider a krill oil cap daily 

## 2018-03-24 NOTE — Patient Instructions (Addendum)
Lidocaine gel for hands  Zyrtec/Cetirizine and Flonase in morning Benadryl/Diphenhyddramine and nasal saline in pm  Preventive Care 40-64 Years, Male Preventive care refers to lifestyle choices and visits with your health care provider that can promote health and wellness. What does preventive care include?  A yearly physical exam. This is also called an annual well check.  Dental exams once or twice a year.  Routine eye exams. Ask your health care provider how often you should have your eyes checked.  Personal lifestyle choices, including: ? Daily care of your teeth and gums. ? Regular physical activity. ? Eating a healthy diet. ? Avoiding tobacco and drug use. ? Limiting alcohol use. ? Practicing safe sex. ? Taking low-dose aspirin every day starting at age 8. What happens during an annual well check? The services and screenings done by your health care provider during your annual well check will depend on your age, overall health, lifestyle risk factors, and family history of disease. Counseling Your health care provider may ask you questions about your:  Alcohol use.  Tobacco use.  Drug use.  Emotional well-being.  Home and relationship well-being.  Sexual activity.  Eating habits.  Work and work Statistician.  Screening You may have the following tests or measurements:  Height, weight, and BMI.  Blood pressure.  Lipid and cholesterol levels. These may be checked every 5 years, or more frequently if you are over 77 years old.  Skin check.  Lung cancer screening. You may have this screening every year starting at age 72 if you have a 30-pack-year history of smoking and currently smoke or have quit within the past 15 years.  Fecal occult blood test (FOBT) of the stool. You may have this test every year starting at age 24.  Flexible sigmoidoscopy or colonoscopy. You may have a sigmoidoscopy every 5 years or a colonoscopy every 10 years starting at age  71.  Prostate cancer screening. Recommendations will vary depending on your family history and other risks.  Hepatitis C blood test.  Hepatitis B blood test.  Sexually transmitted disease (STD) testing.  Diabetes screening. This is done by checking your blood sugar (glucose) after you have not eaten for a while (fasting). You may have this done every 1-3 years.  Discuss your test results, treatment options, and if necessary, the need for more tests with your health care provider. Vaccines Your health care provider may recommend certain vaccines, such as:  Influenza vaccine. This is recommended every year.  Tetanus, diphtheria, and acellular pertussis (Tdap, Td) vaccine. You may need a Td booster every 10 years.  Varicella vaccine. You may need this if you have not been vaccinated.  Zoster vaccine. You may need this after age 72.  Measles, mumps, and rubella (MMR) vaccine. You may need at least one dose of MMR if you were born in 1957 or later. You may also need a second dose.  Pneumococcal 13-valent conjugate (PCV13) vaccine. You may need this if you have certain conditions and have not been vaccinated.  Pneumococcal polysaccharide (PPSV23) vaccine. You may need one or two doses if you smoke cigarettes or if you have certain conditions.  Meningococcal vaccine. You may need this if you have certain conditions.  Hepatitis A vaccine. You may need this if you have certain conditions or if you travel or work in places where you may be exposed to hepatitis A.  Hepatitis B vaccine. You may need this if you have certain conditions or if you travel or work  in places where you may be exposed to hepatitis B.  Haemophilus influenzae type b (Hib) vaccine. You may need this if you have certain risk factors.  Talk to your health care provider about which screenings and vaccines you need and how often you need them. This information is not intended to replace advice given to you by your health  care provider. Make sure you discuss any questions you have with your health care provider. Document Released: 11/04/2015 Document Revised: 06/27/2016 Document Reviewed: 08/09/2015 Elsevier Interactive Patient Education  [employer].

## 2018-03-24 NOTE — Assessment & Plan Note (Signed)
Patient encouraged to maintain heart healthy diet, regular exercise, adequate sleep. Consider daily probiotics. Take medications as prescribed 

## 2018-03-24 NOTE — Assessment & Plan Note (Signed)
Encouraged DASH diet, decrease po intake and increase exercise as tolerated. Needs 7-8 hours of sleep nightly. Avoid trans fats, eat small, frequent meals every 4-5 hours with lean proteins, complex carbs and healthy fats. Minimize simple carbs, offered bariatric referral for now. Less processed foods

## 2018-03-24 NOTE — Progress Notes (Signed)
Subjective:  I acted as a Education administrator for Dr. Charlett Blake. [place], [place]  Patient ID: Tony Paul, male    DOB: [birth year], 57 y.o.   MRN: [id number]  No chief complaint on file.   HPI  Patient is in today for an annual exam and follow up on chronic medical concerns including hyperlipidemia, nocturia, hyperglycemia, obesity and more. He is noting some tingling in his fingers and toes occasionally most notable in right 4 th finger and his heels. No recent febrile illness or hospitalizations. He is doing wel lwith his activities of daily living. He notes a recent increase in his urinary urgency. He also notes some tingling in his fingers and toes at times. Denies CP/palp/SOB/HA/fevers/GI or GU c/o. Taking meds as prescribed. Does note some head congestion   Patient Care Team: Mosie Lukes, MD as PCP - General (Family Medicine) Ladell Pier, MD as Consulting Physician (Oncology)   Past Medical History:  Diagnosis Date  . colon ca dx'd 08/2012  . Depression with anxiety 01/04/2013  . Dyspnea    on exertion  . Headache 11/22/2016  . Health maintenance examination   . Heart murmur   . Heel pain, chronic, left 11/22/2016  . HLD (hyperlipidemia)   . Low back pain 11/22/2016  . Nocturia 04/09/2013  . Restless sleeper 04/09/2013  . Rosacea   . Skin cancer   . 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 Viral URI with cough 11/22/2016    Past Surgical History:  Procedure Laterality Date  . COLON SURGERY  2013  . COLONOSCOPY    . LAPAROSCOPIC LOW ANTERIOR RESECTION  10/09/2012   Procedure: LAPAROSCOPIC LOW ANTERIOR RESECTION;  Surgeon: Leighton Ruff, MD;  Location: [facility];  Service: General;  Laterality: N/A;  . TONSILLECTOMY  1971    Family History  Problem Relation Age of Onset  . Heart failure Father   . Kidney disease Father   . Hypertension Other   . Hyperlipidemia Other   . Kidney disease Other   . Colon cancer Neg Hx   . Prostate cancer Neg Hx   . Esophageal cancer  Neg Hx   . Rectal cancer Neg Hx   . Stomach cancer Neg Hx     Social History   Socioeconomic History  . Marital status: Married    Spouse name: Not on file  . Number of children: Not on file  . Years of education: Not on file  . Highest education level: Not on file  Occupational History  . Not on file  Social Needs  . Financial resource strain: Not on file  . Food insecurity:    Worry: Not on file    Inability: Not on file  . Transportation needs:    Medical: Not on file    Non-medical: Not on file  Tobacco Use  . Smoking status: Never Smoker  . Smokeless tobacco: Never Used  Substance and Sexual Activity  . Alcohol use: Yes    Alcohol/week: 0.6 oz    Types: 1 Glasses of wine per week    Comment: some weeks none  . Drug use: No  . Sexual activity: Yes  Lifestyle  . Physical activity:    Days per week: Not on file    Minutes per session: Not on file  . Stress: Not on file  Relationships  . Social connections:    Talks on phone: Not on file    Gets together: Not on file  Attends religious service: Not on file    Active member of club or organization: Not on file    Attends meetings of clubs or organizations: Not on file    Relationship status: Not on file  . Intimate partner violence:    Fear of current or ex partner: Not on file    Emotionally abused: Not on file    Physically abused: Not on file    Forced sexual activity: Not on file  Other Topics Concern  . 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 alfuzosin (UROXATRAL) 10 MG 24 hr tablet   11  . Ascorbic Acid (VITAMIN C PO) Take 1 tablet by mouth daily.    Marland Kitchen aspirin EC 81 MG tablet Take 81 mg by mouth daily. On occasions    . atorvastatin (LIPITOR) 10 MG tablet TAKE 1 TABLET (10 MG TOTAL) BY MOUTH DAILY. 30 tablet 3  . fish oil-omega-3 fatty acids 1000 MG  capsule Take 3-4 g by mouth daily as needed. On occasion    . metroNIDAZOLE (METROGEL) 0.75 % gel Apply 1 application topically daily.    . Multiple Vitamin (MULTIVITAMIN) tablet Take 1 tablet by mouth daily.    Marland Kitchen scopolamine (TRANSDERM-SCOP, 1.5 MG,) 1 MG/3DAYS Apply 1 patch behind the ear and change every 3 days. 2 patch 0   No facility-administered medications prior to visit.     Allergies  Allergen Reactions  . Cat Hair Extract Other (See Comments)    Itchy eyes  . Other Other (See Comments)    Itchy eyes    Review of Systems  Constitutional: Negative for chills, fever and malaise/fatigue.  HENT: Positive for sinus pain. Negative for congestion and hearing loss.   Eyes: Negative for discharge.  Respiratory: Negative for cough, sputum production and shortness of breath.   Cardiovascular: Negative for chest pain, palpitations and leg swelling.  Gastrointestinal: Negative for abdominal pain, blood in stool, constipation, diarrhea, heartburn, nausea and vomiting.  Genitourinary: Negative for dysuria, frequency, hematuria and urgency.  Musculoskeletal: Negative for back pain, falls and myalgias.  Skin: Negative for rash.  Neurological: Positive for tingling. Negative for dizziness, sensory change, loss of consciousness, weakness and headaches.  Endo/Heme/Allergies: Negative for environmental allergies. Does not bruise/bleed easily.  Psychiatric/Behavioral: Negative for depression and suicidal ideas. The patient is not nervous/anxious and does not have insomnia.        Objective:    Physical Exam  Constitutional: He is oriented to person, place, and time. He appears well-developed and well-nourished. No distress.  HENT:  Head: Normocephalic and atraumatic.  Eyes: Conjunctivae are normal.  Neck: Neck supple. No thyromegaly present.  Cardiovascular: Normal rate, regular rhythm and normal heart sounds.  No murmur heard. Pulmonary/Chest: Effort normal and breath sounds normal. No  respiratory distress. He has no wheezes.  Abdominal: Soft. Bowel sounds are normal. He exhibits no mass. There is no tenderness.  Musculoskeletal: He exhibits no edema.  Lymphadenopathy:    He has no cervical adenopathy.  Neurological: He is alert and oriented to person, place, and time.  Skin: Skin is warm and dry.  Psychiatric: He has a normal mood and affect. His behavior is normal.    BP 120/82 (BP Location: Left Arm, Patient Position: Sitting, Cuff Size: Normal)   Pulse 75   Temp 97.8 F (36.6  C) (Oral)   Resp 18   Ht 5\' 10"  (1.778 m)   Wt 219 lb (99.3 kg)   SpO2 97%   BMI 31.42 kg/m  Wt Readings from Last 3 Encounters:  03/24/18 219 lb (99.3 kg)  10/16/17 219 lb (99.3 kg)  12/17/16 225 lb 6.4 oz (102.2 kg)   BP Readings from Last 3 Encounters:  03/24/18 120/82  10/16/17 134/85  12/17/16 (!) 129/91     Immunization History  Administered Date(s) Administered  . Influenza Split 07/17/2012  . Influenza,inj,Quad PF,6+ Mos 07/06/2013, 07/02/2014  . Td 06/29/2010    Health Maintenance  Topic Date Due  . Hepatitis C Screening  12/10/1960  . HIV Screening  06/03/1976  . INFLUENZA VACCINE  05/22/2018  . TETANUS/TDAP  06/29/2020  . COLONOSCOPY  09/17/2021    Lab Results  Component Value Date   WBC 5.4 03/24/2018   HGB 15.2 03/24/2018   HCT 44.4 03/24/2018   PLT 209.0 03/24/2018   GLUCOSE 96 03/24/2018   CHOL 139 02/27/2017   TRIG 158.0 (H) 02/27/2017   HDL 37.20 (L) 02/27/2017   LDLCALC 70 02/27/2017   ALT 12 03/24/2018   AST 15 03/24/2018   NA 141 03/24/2018   K 5.0 03/24/2018   CL 104 03/24/2018   CREATININE 0.96 03/24/2018   BUN 12 03/24/2018   CO2 32 03/24/2018   TSH 1.48 03/24/2018   PSA 0.62 03/24/2018   HGBA1C 5.5 03/24/2018    Lab Results  Component Value Date   TSH 1.48 03/24/2018   Lab Results  Component Value Date   WBC 5.4 03/24/2018   HGB 15.2 03/24/2018   HCT 44.4 03/24/2018   MCV 88.4 03/24/2018   PLT 209.0 03/24/2018   Lab  Results  Component Value Date   NA 141 03/24/2018   K 5.0 03/24/2018   CO2 32 03/24/2018   GLUCOSE 96 03/24/2018   BUN 12 03/24/2018   CREATININE 0.96 03/24/2018   BILITOT 0.9 03/24/2018   ALKPHOS 67 03/24/2018   AST 15 03/24/2018   ALT 12 03/24/2018   PROT 6.8 03/24/2018   ALBUMIN 4.8 03/24/2018   CALCIUM 9.7 03/24/2018   GFR 85.87 03/24/2018   Lab Results  Component Value Date   CHOL 139 02/27/2017   Lab Results  Component Value Date   HDL 37.20 (L) 02/27/2017   Lab Results  Component Value Date   LDLCALC 70 02/27/2017   Lab Results  Component Value Date   TRIG 158.0 (H) 02/27/2017   Lab Results  Component Value Date   CHOLHDL 4 02/27/2017   Lab Results  Component Value Date   HGBA1C 5.5 03/24/2018         Assessment & Plan:   Problem List Items Addressed This Visit    Hyperlipidemia    Encouraged heart healthy diet, increase exercise, avoid trans fats, consider a krill oil cap daily      Annual physical exam    Patient encouraged to maintain heart healthy diet, regular exercise, adequate sleep. Consider daily probiotics. Take medications as prescribed      Relevant Orders   PSA (Completed)   CBC (Completed)   Comprehensive metabolic panel (Completed)   TSH (Completed)   Obesity    Encouraged DASH diet, decrease po intake and increase exercise as tolerated. Needs 7-8 hours of sleep nightly. Avoid trans fats, eat small, frequent meals every 4-5 hours with lean proteins, complex carbs and healthy fats. Minimize simple carbs, offered bariatric referral for now. Less  processed foods      Arthritis   Hyperglycemia    hgba1c acceptable, minimize simple carbs. Increase exercise as tolerated.       Relevant Orders   Hemoglobin A1c (Completed)   Urinary frequency - Primary   Relevant Orders   Urinalysis (Completed)   Urine Culture (Completed)   Ambulatory referral to Urology      I am having Tony Paul maintain his aspirin EC, fish  oil-omega-3 fatty acids, multivitamin, metroNIDAZOLE, Ascorbic Acid (VITAMIN C PO), alfuzosin, atorvastatin, and scopolamine.  No orders of the defined types were placed in this encounter. CMA served as Education administrator during this visit. History, Physical and Plan performed by medical provider. Documentation and orders reviewed and attested to.  Penni Homans, MD

## 2018-03-25 LAB — URINE CULTURE
MICRO NUMBER:: 90664548
Result:: NO GROWTH
SPECIMEN QUALITY:: ADEQUATE

## 2018-03-26 DIAGNOSIS — R350 Frequency of micturition: Secondary | ICD-10-CM | POA: Insufficient documentation

## 2018-03-26 DIAGNOSIS — R739 Hyperglycemia, unspecified: Secondary | ICD-10-CM | POA: Insufficient documentation

## 2018-03-26 NOTE — Assessment & Plan Note (Signed)
Referred back to his urologist. Dr Leta Jungling.

## 2018-03-26 NOTE — Assessment & Plan Note (Signed)
Sounds mostly allergic. Zyrtec/Cetirizine and Flonase in morning Benadryl/Diphenhyddramine and nasal saline in pm

## 2018-03-26 NOTE — Assessment & Plan Note (Signed)
hgba1c acceptable, minimize simple carbs. Increase exercise as tolerated.  

## 2018-07-28 ENCOUNTER — Ambulatory Visit: Payer: BLUE CROSS/BLUE SHIELD | Admitting: Medical

## 2018-07-28 ENCOUNTER — Encounter: Payer: Self-pay | Admitting: Medical

## 2018-07-28 VITALS — BP 117/91 | HR 78 | Temp 98.1°F | Resp 16 | Ht 70.0 in | Wt 225.4 lb

## 2018-07-28 DIAGNOSIS — J329 Chronic sinusitis, unspecified: Secondary | ICD-10-CM | POA: Diagnosis not present

## 2018-07-28 DIAGNOSIS — J309 Allergic rhinitis, unspecified: Secondary | ICD-10-CM

## 2018-07-28 MED ORDER — FLUTICASONE PROPIONATE 50 MCG/ACT NA SUSP: 2.0000 | Freq: Every day | NASAL | 3 refills | Status: DC

## 2018-07-28 MED ORDER — BENZONATATE 100 MG PO CAPS: 100.0000 mg | ORAL_CAPSULE | Freq: Three times a day (TID) | ORAL | 0 refills | Status: DC | PRN

## 2018-07-28 MED ORDER — LEVOCETIRIZINE DIHYDROCHLORIDE 5 MG PO TABS: 5.0000 mg | ORAL_TABLET | Freq: Every evening | ORAL | 3 refills | Status: DC

## 2018-07-28 MED ORDER — AZITHROMYCIN 250 MG PO TABS: ORAL_TABLET | ORAL | 0 refills | Status: DC

## 2018-07-28 MED FILL — FLUTICASONE PROP 50 MCG SPR: 50 | 30 days supply | Qty: 16 | Fill #0

## 2018-07-28 MED FILL — LEVOCETIRIZINE 5 MG TABLET: 5 | 30 days supply | Qty: 30 | Fill #0

## 2018-07-28 MED FILL — BENZONATATE 100 MG CAPSULE: 100 | 10 days supply | Qty: 30 | Fill #0

## 2018-07-28 MED FILL — AZITHROMYCIN 250 MG TABLET: 250 | 5 days supply | Qty: 6 | Fill #0

## 2018-07-28 NOTE — Progress Notes (Signed)
Subjective:    Patient ID: Tony Paul, male    DOB: Nov 14, 1960, 56 y.o.   MRN: [id number]  HPI  Pt in states he has seasonal allergies but recently he feels like worse recently.  Pt states spring and fall allergies. Also new puppy house. Pt also told allergic to pet dander in the past.  Pt states got nasal congestion for past week. Last day got worse. Sinus pressure past 24 hours(some hx of sinus infections occasional). If blows nose will get yellow mucus. Moderate post nasal drainage.   About to head out of town to [place] and wants to be better sooner/not worse on trip.     Review of Systems  Constitutional: Negative for chills, diaphoresis and fatigue.  HENT: Positive for congestion, postnasal drip and sinus pressure. Negative for ear discharge, sinus pain and sneezing.   Respiratory: Positive for cough. Negative for chest tightness, shortness of breath and wheezing.   Cardiovascular: Negative for chest pain and palpitations.  Gastrointestinal: Negative for abdominal pain.  Musculoskeletal: Negative for back pain, myalgias and neck pain.  Skin: Negative for rash.  Neurological: Negative for dizziness, syncope, speech difficulty, weakness and numbness.  Psychiatric/Behavioral: Negative for behavioral problems, confusion and suicidal ideas. The patient is not nervous/anxious.     Past Medical History:  Diagnosis Date  . colon ca dx'd 08/2012  . Depression with anxiety 01/04/2013  . Dyspnea    on exertion  . Headache 11/22/2016  . Health maintenance examination   . Heart murmur   . Heel pain, chronic, left 11/22/2016  . HLD (hyperlipidemia)   . Low back pain 11/22/2016  . Nocturia 04/09/2013  . Restless sleeper 04/09/2013  . Rosacea   . Skin cancer   . 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 Viral URI with cough 11/22/2016     Social History   Socioeconomic History  . Marital status: Married    Spouse name: Not on file  . Number of children: Not on  file  . Years of education: Not on file  . Highest education level: Not on file  Occupational History  . Not on file  Social Needs  . Financial resource strain: Not on file  . Food insecurity:    Worry: Not on file    Inability: Not on file  . Transportation needs:    Medical: Not on file    Non-medical: Not on file  Tobacco Use  . Smoking status: Never Smoker  . Smokeless tobacco: Never Used  Substance and Sexual Activity  . Alcohol use: Yes    Alcohol/week: 1.0 standard drinks    Types: 1 Glasses of wine per week    Comment: some weeks none  . Drug use: No  . Sexual activity: Yes  Lifestyle  . Physical activity:    Days per week: Not on file    Minutes per session: Not on file  . Stress: Not on file  Relationships  . Social connections:    Talks on phone: Not on file    Gets together: Not on file    Attends religious service: Not on file    Active member of club or organization: Not on file    Attends meetings of clubs or organizations: Not on file    Relationship status: Not on file  . Intimate partner violence:    Fear of current or ex partner: Not on file    Emotionally abused: Not on file  Physically abused: Not on file    Forced sexual activity: Not on file  Other Topics Concern  . 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 COLON SURGERY  2013  . COLONOSCOPY    . LAPAROSCOPIC LOW ANTERIOR RESECTION  10/09/2012   Procedure: LAPAROSCOPIC LOW ANTERIOR RESECTION;  Surgeon: Leighton Ruff, MD;  Location: [facility];  Service: General;  Laterality: N/A;  . TONSILLECTOMY  1971    Family History  Problem Relation Age of Onset  . Heart failure Father   . Kidney disease Father   . Hypertension Other   . Hyperlipidemia Other   . Kidney disease Other   . Colon cancer Neg Hx   . Prostate cancer Neg Hx   . Esophageal cancer Neg Hx   .  Rectal cancer Neg Hx   . Stomach cancer Neg Hx     Allergies  Allergen Reactions  . Cat Hair Extract Other (See Comments)    Itchy eyes  . Other Other (See Comments)    Itchy eyes    Current Outpatient Medications on File Prior to Visit  Medication Sig Dispense Refill  . Ascorbic Acid (VITAMIN C PO) Take 1 tablet by mouth daily.    Marland Kitchen aspirin EC 81 MG tablet Take 81 mg by mouth daily. On occasions    . fish oil-omega-3 fatty acids 1000 MG capsule Take 3-4 g by mouth daily as needed. On occasion    . Multiple Vitamin (MULTIVITAMIN) tablet Take 1 tablet by mouth daily.     No current facility-administered medications on file prior to visit.     BP (!) 117/91   Pulse 78   Temp 98.1 F (36.7 C) (Oral)   Resp 16   Ht 5\' 10"  (1.778 m)   Wt 225 lb 6.4 oz (102.2 kg)   SpO2 98%   BMI 32.34 kg/m       Objective:   Physical Exam  General  Mental Status - Alert. General Appearance - Well groomed. Not in acute distress.  Skin Rashes- No Rashes.  HEENT Head- Normal. Ear Auditory Canal - Left- Normal. Right - Normal.Tympanic Membrane- Left- Normal. Right- Normal. Eye Sclera/Conjunctiva- Left- Normal. Right- Normal. Nose & Sinuses Nasal Mucosa- Left-  Boggy and Congested. Right-  Boggy and  Congested.Bilateral no maxillary and no  frontal sinus pressure. Mouth & Throat Lips: Upper Lip- Normal: no dryness, cracking, pallor, cyanosis, or vesicular eruption. Lower Lip-Normal: no dryness, cracking, pallor, cyanosis or vesicular eruption. Buccal Mucosa- Bilateral- No Aphthous ulcers. Oropharynx- No Discharge or Erythema. +pnd. Tonsils: Characteristics- Bilateral- No Erythema or Congestion. Size/Enlargement- Bilateral- No enlargement. Discharge- bilateral-None.  Neck Neck- Supple. No Masses.   Chest and Lung Exam Auscultation: Breath Sounds:-Clear even and unlabored.  Cardiovascular Auscultation:Rythm- Regular, rate and rhythm. Murmurs & Other Heart Sounds:Ausculatation of  the heart reveal- No Murmurs.  Lymphatic Head & Neck General Head & Neck Lymphatics: Bilateral: Description- No Localized lymphadenopathy.       Assessment & Plan:  You do appear to have recent allergic rhinitis flare with early sinus infection. I did prescribe flonase nasal spray, xyzal antihistamine  and azithromycin antibiotic.  You should gradually improve. Update [place] on Friday or Monday after trip if not improved.  For intermittent cough making benzonatate available.  Follow up in 7 days or as needed  Since already  congested and about to fly to [place] explained he could use afrin preflight to help with congestion.

## 2018-07-28 NOTE — Patient Instructions (Addendum)
You do appear to have recent allergic rhinitis flare with early sinus infection. I did prescribe flonase nasal spray, xyzal antihistamine  and azithromycin antibiotic.  You should gradually improve. Update [place] on Friday or Monday after trip if not improved.  For intermittent cough making benzonatate available.  Follow up in 7 days or as needed

## 2018-12-15 ENCOUNTER — Ambulatory Visit: Payer: BLUE CROSS/BLUE SHIELD | Admitting: Family Medicine

## 2018-12-15 ENCOUNTER — Encounter: Payer: Self-pay | Admitting: Family Medicine

## 2018-12-15 VITALS — BP 100/68 | HR 86 | Temp 99.0°F | Ht 69.0 in | Wt 230.0 lb

## 2018-12-15 DIAGNOSIS — R197 Diarrhea, unspecified: Secondary | ICD-10-CM

## 2018-12-15 DIAGNOSIS — R6889 Other general symptoms and signs: Secondary | ICD-10-CM

## 2018-12-15 MED ORDER — OSELTAMIVIR PHOSPHATE 75 MG PO CAPS: 75.0000 mg | ORAL_CAPSULE | Freq: Two times a day (BID) | ORAL | 0 refills | Status: AC

## 2018-12-15 MED FILL — OSELTAMIVIR PHOSPHATE 75 MG: 75 | 5 days supply | Qty: 10 | Fill #0

## 2018-12-15 NOTE — Progress Notes (Signed)
Chief Complaint  Patient presents with  . Diarrhea    started sunday 12/14/2018  . Shortness of Breath  . Cough  . Fever    Tony Paul here for URI complaints.  Duration: 1 day  Associated symptoms: Fever (100 F), itchy watery eyes, sore throat, myalgia, diarrhea, and cough Denies: sinus pain, ear pain, ear drainage, wheezing, N/V, bleeding, abd pain, and shortness of breath Treatment to date: Tylenol, otc flu meds Sick contacts: No  ROS:  Const: + fevers HEENT: As noted in HPI Lungs: No SOB  Past Medical History:  Diagnosis Date  . colon ca dx'd 08/2012  . Depression with anxiety 01/04/2013  . Headache 11/22/2016  . Health maintenance examination   . Heart murmur   . Heel pain, chronic, left 11/22/2016  . HLD (hyperlipidemia)   . Low back pain 11/22/2016  . Nocturia 04/09/2013  . Restless sleeper 04/09/2013  . Rosacea   . Skin cancer   . 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    BP 100/68 (BP Location: Left Arm, Patient Position: Sitting, Cuff Size: Normal)   Pulse 86   Temp 99 F (37.2 C) (Oral)   Ht 5\' 9"  (1.753 m)   Wt 230 lb (104.3 kg)   SpO2 94%   BMI 33.97 kg/m  General: Awake, alert, appears stated age HEENT: AT, Oxly, ears patent b/l and TM's neg, nares patent w/o discharge, pharynx pink and without exudates, MMM Neck: No masses or asymmetry Heart: RRR Lungs: CTAB, no accessory muscle use Abd: BS hyperactive, S, diffuse TTP, ND, no masses or organomegaly Psych: Age appropriate judgment and insight, normal mood and affect  Flu-like symptoms - Plan: oseltamivir (TAMIFLU) 75 MG capsule  Diarrhea, unspecified type  Orders as above. Continue to push fluids, practice good hand hygiene, cover mouth when coughing. If diarrhea no better by Th in 3 days, let us know and will consider stool sampling.  F/u prn. If starting to experience fevers, shaking, or shortness of breath, seek immediate care. Pt voiced understanding and agreement to the  plan.  Galliano, DO 12/15/18 11:51 AM

## 2018-12-15 NOTE — Patient Instructions (Addendum)
Continue to push fluids, practice good hand hygiene, and cover your mouth if you cough.  If you start having increasing fevers, shaking or shortness of breath, seek immediate care.  OK to take Tylenol 1000 mg (2 extra strength tabs) or 975 mg (3 regular strength tabs) every 6 hours as needed.  Ibuprofen 400-600 mg (2-3 over the counter strength tabs) every 6 hours as needed for pain.  If you still have diarrhea that is not resolving on Thursday, let me know.   Let us know if you need anything.

## 2019-03-22 ENCOUNTER — Ambulatory Visit (INDEPENDENT_AMBULATORY_CARE_PROVIDER_SITE_OTHER)
Admission: RE | Admit: 2019-03-22 | Discharge: 2019-03-22 | Disposition: A | Discharge status: Home / Self Care | Payer: BLUE CROSS/BLUE SHIELD | Source: Ambulatory Visit

## 2019-03-22 ENCOUNTER — Inpatient Hospital Stay
Admission: RE | Admit: 2019-03-22 | Discharge: 2019-03-22 | Disposition: A | Discharge status: Home / Self Care | Payer: BLUE CROSS/BLUE SHIELD | Source: Ambulatory Visit

## 2019-03-22 ENCOUNTER — Inpatient Hospital Stay: Admission: RE | Admit: 2019-03-22 | Payer: BLUE CROSS/BLUE SHIELD | Source: Ambulatory Visit

## 2019-03-22 DIAGNOSIS — J019 Acute sinusitis, unspecified: Secondary | ICD-10-CM | POA: Diagnosis not present

## 2019-03-22 DIAGNOSIS — B9689 Other specified bacterial agents as the cause of diseases classified elsewhere: Secondary | ICD-10-CM

## 2019-03-22 MED ORDER — FLUTICASONE PROPIONATE 50 MCG/ACT NA SUSP: 1.0000 | Freq: Every day | NASAL | 2 refills | Status: DC

## 2019-03-22 MED ORDER — AMOXICILLIN-POT CLAVULANATE 875-125 MG PO TABS: 1.0000 | ORAL_TABLET | Freq: Two times a day (BID) | ORAL | 0 refills | Status: AC

## 2019-03-22 NOTE — ED Provider Notes (Signed)
Virtual Visit via Video Note:  Tony Paul  initiated request for Telemedicine visit with [facility] Urgent [facility] team. I connected with Tony Paul  on 03/22/2019 at 2:26 PM  for a synchronized telemedicine visit using a video enabled HIPPA compliant telemedicine application. I verified that I am speaking with Tony Paul  using two identifiers. Zigmund Gottron, NP  was physically located in a [facility] Urgent care site and Tony Paul was located at a different location.   The limitations of evaluation and management by telemedicine as well as the availability of in-person appointments were discussed. Patient was informed that he  may incur a bill ( including co-pay) for this virtual visit encounter. Tony Paul  expressed understanding and gave verbal consent to proceed with virtual visit.     History of Present Illness:Tony Paul  is a 58 y.o. male presents with complaints of persistent sinus pressure with cough due to post nasal drip. He feels it started approximately a month ago. No known fevers. No shortness of breath . No ear pain or sore throat. Pain into upper jaw from pain but no specific dental pain or tooth pain. Has had previous similar, seems to be seasonal he states. No known ill contacts. Has been trying OTC medications, sudafed and allergy medications which haven't helped.   Past Medical History:  Diagnosis Date  . colon ca dx'd 08/2012  . Depression with anxiety 01/04/2013  . Headache 11/22/2016  . Health maintenance examination   . Heart murmur   . Heel pain, chronic, left 11/22/2016  . HLD (hyperlipidemia)   . Low back pain 11/22/2016  . Nocturia 04/09/2013  . Restless sleeper 04/09/2013  . Rosacea   . Skin cancer   . 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 Cat Hair Extract Other (See Comments)    Itchy eyes  . Other Other (See Comments)    Itchy eyes        Observations/Objective: Alert, oriented, non  toxic in appearance. Clear coherent speech without difficulty. No increased work of breathing visualized. No visible nasal drainage. No difficulty swallowing. Eyes with normal contact and no visible redness or drainage.   Assessment and Plan: 1 month of symptoms. Will treat with antibiotics for sinusitis. Return precautions provided. Patient verbalized understanding and agreeable to plan.    Follow Up Instructions:    I discussed the assessment and treatment plan with the patient. The patient was provided an opportunity to ask questions and all were answered. The patient agreed with the plan and demonstrated an understanding of the instructions.   The patient was advised to call back or seek an in-person evaluation if the symptoms worsen or if the condition fails to improve as anticipated.  I provided 13 minutes of non-face-to-face time during this encounter.    Zigmund Gottron, NP  03/22/2019 2:26 PM         Zigmund Gottron, NP 03/22/19 1428

## 2019-03-22 NOTE — Discharge Instructions (Addendum)
Nice to meet you! We will start antibiotics for sinus infection.  Complete course.  Daily nasal spray, used regularly may also help with your facial pressure.  Might try stopping the sudafed as this sometimes can increase the pressure if you aren't getting any drainage.  Push fluids to ensure adequate hydration and keep secretions thin.  Tylenol and/or ibuprofen as needed for pain or fevers.  If symptoms worsen or do not improve with antibiotics please be seen in person at our urgent care or with your primary care provider.

## 2019-06-16 ENCOUNTER — Telehealth: Payer: Self-pay | Admitting: Medical

## 2019-06-16 MED ORDER — PERMETHRIN 5 % EX CREA: TOPICAL_CREAM | CUTANEOUS | 0 refills | Status: DC

## 2019-06-16 MED ORDER — HYDROXYZINE HCL 10 MG PO TABS: ORAL_TABLET | ORAL | 0 refills | Status: DC

## 2019-06-16 MED FILL — PERMETHRIN 5% CREAM: 5 | 7 days supply | Qty: 60 | Fill #0

## 2019-06-16 MED FILL — hydrOXYzine HCL 10 MG TABS: 10 | 5 days supply | Qty: 30 | Fill #0

## 2019-06-16 NOTE — Telephone Encounter (Signed)
Pet with sarcoptic mange. Wife with scabies type eruption and itching. Pt wife describes husbnd same signs/symptoms. Rx permetrhin and hydroxyzine.

## 2019-06-23 ENCOUNTER — Encounter: Payer: BLUE CROSS/BLUE SHIELD | Admitting: Family Medicine

## 2019-07-13 ENCOUNTER — Other Ambulatory Visit: Payer: Self-pay

## 2019-07-13 ENCOUNTER — Encounter: Payer: BLUE CROSS/BLUE SHIELD | Admitting: Family Medicine

## 2019-07-17 ENCOUNTER — Other Ambulatory Visit: Payer: Self-pay

## 2019-07-17 ENCOUNTER — Ambulatory Visit: Payer: BC Managed Care – PPO | Admitting: Family Medicine

## 2019-07-17 ENCOUNTER — Ambulatory Visit (INDEPENDENT_AMBULATORY_CARE_PROVIDER_SITE_OTHER): Payer: BC Managed Care – PPO | Admitting: Medical

## 2019-07-17 ENCOUNTER — Ambulatory Visit (HOSPITAL_BASED_OUTPATIENT_CLINIC_OR_DEPARTMENT_OTHER)
Admission: RE | Admit: 2019-07-17 | Discharge: 2019-07-17 | Disposition: A | Discharge status: Home / Self Care | Payer: BC Managed Care – PPO | Source: Ambulatory Visit | Attending: Medical | Admitting: Medical

## 2019-07-17 ENCOUNTER — Encounter: Payer: Self-pay | Admitting: Medical

## 2019-07-17 VITALS — BP 112/80 | HR 71 | Temp 96.5°F | Ht 69.0 in | Wt 226.0 lb

## 2019-07-17 DIAGNOSIS — M2550 Pain in unspecified joint: Secondary | ICD-10-CM | POA: Diagnosis not present

## 2019-07-17 DIAGNOSIS — Z125 Encounter for screening for malignant neoplasm of prostate: Secondary | ICD-10-CM

## 2019-07-17 DIAGNOSIS — R5383 Other fatigue: Secondary | ICD-10-CM

## 2019-07-17 DIAGNOSIS — G8929 Other chronic pain: Secondary | ICD-10-CM

## 2019-07-17 DIAGNOSIS — M25511 Pain in right shoulder: Secondary | ICD-10-CM

## 2019-07-17 DIAGNOSIS — N401 Enlarged prostate with lower urinary tract symptoms: Secondary | ICD-10-CM

## 2019-07-17 DIAGNOSIS — Z0001 Encounter for general adult medical examination with abnormal findings: Secondary | ICD-10-CM

## 2019-07-17 DIAGNOSIS — M79641 Pain in right hand: Secondary | ICD-10-CM | POA: Diagnosis not present

## 2019-07-17 DIAGNOSIS — Z23 Encounter for immunization: Secondary | ICD-10-CM

## 2019-07-17 DIAGNOSIS — M25521 Pain in right elbow: Secondary | ICD-10-CM

## 2019-07-17 DIAGNOSIS — Z0000 Encounter for general adult medical examination without abnormal findings: Secondary | ICD-10-CM | POA: Diagnosis not present

## 2019-07-17 DIAGNOSIS — Z1159 Encounter for screening for other viral diseases: Secondary | ICD-10-CM | POA: Diagnosis not present

## 2019-07-17 LAB — CBC WITH DIFFERENTIAL/PLATELET
Basophils Absolute: 0 10*3/uL (ref 0.0–0.1)
Basophils Relative: 0.4 % (ref 0.0–3.0)
Eosinophils Absolute: 0.1 10*3/uL (ref 0.0–0.7)
Eosinophils Relative: 1.5 % (ref 0.0–5.0)
HCT: 43 % (ref 39.0–52.0)
Hemoglobin: 14.4 g/dL (ref 13.0–17.0)
Lymphocytes Relative: 27.4 % (ref 12.0–46.0)
Lymphs Abs: 1.5 10*3/uL (ref 0.7–4.0)
MCHC: 33.5 g/dL (ref 30.0–36.0)
MCV: 88.3 fl (ref 78.0–100.0)
Monocytes Absolute: 0.4 10*3/uL (ref 0.1–1.0)
Monocytes Relative: 7.2 % (ref 3.0–12.0)
Neutro Abs: 3.6 10*3/uL (ref 1.4–7.7)
Neutrophils Relative %: 63.5 % (ref 43.0–77.0)
Platelets: 220 10*3/uL (ref 150.0–400.0)
RBC: 4.87 Mil/uL (ref 4.22–5.81)
RDW: 13.4 % (ref 11.5–15.5)
WBC: 5.6 10*3/uL (ref 4.0–10.5)

## 2019-07-17 LAB — LIPID PANEL
Cholesterol: 228 mg/dL — ABNORMAL HIGH (ref 0–200)
HDL: 40.6 mg/dL (ref >39.00)
LDL Cholesterol: 158 mg/dL — ABNORMAL HIGH (ref 0–99)
NonHDL: 187.29
Total CHOL/HDL Ratio: 6
Triglycerides: 144 mg/dL (ref 0.0–149.0)
VLDL: 28.8 mg/dL (ref 0.0–40.0)

## 2019-07-17 LAB — COMPREHENSIVE METABOLIC PANEL
ALT: 15 U/L (ref 0–53)
AST: 15 U/L (ref 0–37)
Albumin: 4.6 g/dL (ref 3.5–5.2)
Alkaline Phosphatase: 70 U/L (ref 39–117)
BUN: 16 mg/dL (ref 6–23)
CO2: 28 mEq/L (ref 19–32)
Calcium: 9.6 mg/dL (ref 8.4–10.5)
Chloride: 103 mEq/L (ref 96–112)
Creatinine, Ser: 1.03 mg/dL (ref 0.40–1.50)
GFR: 74.14 mL/min (ref >60.00)
Glucose, Bld: 90 mg/dL (ref 70–99)
Potassium: 4.7 mEq/L (ref 3.5–5.1)
Sodium: 140 mEq/L (ref 135–145)
Total Bilirubin: 0.9 mg/dL (ref 0.2–1.2)
Total Protein: 6.6 g/dL (ref 6.0–8.3)

## 2019-07-17 LAB — VITAMIN B12: Vitamin B-12: 367 pg/mL (ref 211–911)

## 2019-07-17 LAB — SEDIMENTATION RATE: Sed Rate: 6 mm/hr (ref 0–20)

## 2019-07-17 LAB — C-REACTIVE PROTEIN: CRP: <1 mg/dL (ref 0.5–20.0)

## 2019-07-17 LAB — TSH: TSH: 1.06 u[IU]/mL (ref 0.35–4.50)

## 2019-07-17 LAB — PSA: PSA: 1.26 ng/mL (ref 0.10–4.00)

## 2019-07-17 MED ORDER — LEVOCETIRIZINE DIHYDROCHLORIDE 5 MG PO TABS: 5.0000 mg | ORAL_TABLET | Freq: Every evening | ORAL | 3 refills | Status: DC

## 2019-07-17 MED ORDER — FLUTICASONE PROPIONATE 50 MCG/ACT NA SUSP: 2.0000 | Freq: Every day | NASAL | 1 refills | Status: DC

## 2019-07-17 MED ORDER — TAMSULOSIN HCL 0.4 MG PO CAPS: 0.4000 mg | ORAL_CAPSULE | Freq: Every day | ORAL | 3 refills | Status: DC

## 2019-07-17 MED FILL — FLUTICASONE PROP 50 MCG SPR: 50 | 30 days supply | Qty: 16 | Fill #0

## 2019-07-17 MED FILL — TAMSULOSIN HCL 0.4 MG CAP: 0.4 | 30 days supply | Qty: 30 | Fill #0

## 2019-07-17 MED FILL — LEVOCETIRIZINE 5 MG TABLET: 5 | 30 days supply | Qty: 30 | Fill #0

## 2019-07-17 MED FILL — SHINGRIX 50 MCG SUS: 50 | 1 days supply | Qty: 1 | Fill #0

## 2019-07-17 NOTE — Patient Instructions (Addendum)
For you wellness exam today I have ordered cbc, cmp, psa and  lipid panel.  Flu vaccine today.   Recommend exercise and healthy diet.  We will let you know lab results as they come in.  Follow up date appointment will be determined after lab review.   For right elbow pain/probable lateral epicondylitis, I did go ahead and place referral for sports medicine.  You can use some low-dose ibuprofen during the interim to help deal with the discomfort.  Also mention your chronic right shoulder pain as he could evaluate that area as well.  For history of probable BPH, I did refill your Flomax.  Went ahead and referred you back to your former urologist.  For history of arthralgias worse in the right hand, I did place arthritis panel.  For mild fatigue, did add B12, B1 and TSH to your lab work.   For allergic rhinitis, I did prescribe Xyzal antihistamine and Flonase.  On discussion and evaluation I do not think that you have COVID type presentation.  But we need to be very cautious.  Recommend that you stay at home over the weekend.  If you do not have the clinical improvement by Monday then I would have you tested at [place] [facility] for [place] and write your work note pending test results.  Recommend try weight watchers app to help you lose weight.  Follow-up date to be determined after lab review.

## 2019-07-17 NOTE — Addendum Note (Signed)
Addended by: Hinton Dyer on: 07/17/2019 01:36 PM   Modules accepted: Orders

## 2019-07-17 NOTE — Progress Notes (Signed)
Subjective:    Patient ID: Tony Paul, male    DOB: November 22, 1960, 58 y.o.   MRN: [id number]  HPI  Patient is here for annual physical.   Pt is fasting.   Pt exercises moderate during the weak.  Complaints of urgency, frequency, hesitancy. He saw urology in 2018. Was started on alfluzosin, but has not had any recent follow-up and is not taking it currently. States he is taking a supplement but cannot remember what it is called.   Seasonal/pet allergies. Mild headaches daily, sinus congestion, can affect sleep. Occasionally takes OTC allergy medication - unsure of name. Has not been taking his xyzal and only uses flonase as needed. He states feels pnd recently. Has not used meds. Fall and spring allergies. But not on meds recently. Symptoms for one week and making some improvement  Arthritis worse in right hand. Takes glucosamine-chondroitin with some relief. Morning hand pain in pip joints bilateral for years.  Sore right elbow/shoulder following tennis, pickle ball, disk golf. Right shoulder soreness and bruising. Elbow has been bothering him for months. Known rt shoulder need for surgery(rotator cuff). Now rt elbow pain for months.   Hx colon cancer with resection 2013 - Next colonoscopy due 2022 per chart.  States he has been exercising regularly, eating a healthy diet, staying hydrated, however feels like he is having a hard time losing weight. Reports mild fatigue every afternoon.   Flu shot today.       Review of Systems  Constitutional: Positive for fatigue. Negative for chills and fever.  HENT: Positive for congestion. Negative for sinus pain and sore throat.        Pnd  Eyes: Negative for visual disturbance.  Respiratory: Negative for apnea, shortness of breath and wheezing.   Cardiovascular: Negative for chest pain and palpitations.  Gastrointestinal: Negative for abdominal pain, constipation, diarrhea and nausea.  Endocrine: Negative for polydipsia, polyphagia and  polyuria.  Genitourinary: Positive for frequency and urgency. Negative for dysuria and hematuria.  Musculoskeletal: Positive for arthralgias.       Right shoulder and elbow soreness; right hand arthritis, mild left hand arthritis  Skin: Negative for rash.  Neurological: Positive for headaches. Negative for dizziness and light-headedness.  Hematological: Negative for adenopathy. Does not bruise/bleed easily.  Psychiatric/Behavioral: Negative for agitation and suicidal ideas. The patient is not nervous/anxious.    Past Medical History:  Diagnosis Date  . colon ca dx'd 08/2012  . Depression with anxiety 01/04/2013  . Headache 11/22/2016  . Health maintenance examination   . Heart murmur   . Heel pain, chronic, left 11/22/2016  . HLD (hyperlipidemia)   . Low back pain 11/22/2016  . Nocturia 04/09/2013  . Restless sleeper 04/09/2013  . Rosacea   . Skin cancer   . 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 Marital status: Married    Spouse name: Not on file  . Number of children: Not on file  . Years of education: Not on file  . Highest education level: Not on file  Occupational History  . Not on file  Social Needs  . Financial resource strain: Not on file  . Food insecurity    Worry: Not on file    Inability: Not on file  . Transportation needs    Medical: Not on file    Non-medical: Not on file  Tobacco Use  . Smoking status: Never Smoker  . Smokeless  tobacco: Never Used  Substance and Sexual Activity  . Alcohol use: Yes    Alcohol/week: 1.0 standard drinks    Types: 1 Glasses of wine per week    Comment: some weeks none  . Drug use: No  . Sexual activity: Yes  Lifestyle  . Physical activity    Days per week: Not on file    Minutes per session: Not on file  . Stress: Not on file  Relationships  . Social Herbalist on phone: Not on file    Gets together: Not on file    Attends religious service: Not  on file    Active member of club or organization: Not on file    Attends meetings of clubs or organizations: Not on file    Relationship status: Not on file  . Intimate partner violence    Fear of current or ex partner: Not on file    Emotionally abused: Not on file    Physically abused: Not on file    Forced sexual activity: Not on file  Other Topics Concern  . 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 COLON SURGERY  2013  . COLONOSCOPY    . LAPAROSCOPIC LOW ANTERIOR RESECTION  10/09/2012   Procedure: LAPAROSCOPIC LOW ANTERIOR RESECTION;  Surgeon: Leighton Ruff, MD;  Location: [facility];  Service: General;  Laterality: N/A;  . TONSILLECTOMY  1971    Family History  Problem Relation Age of Onset  . Heart failure Father   . Kidney disease Father   . Hypertension Other   . Hyperlipidemia Other   . Kidney disease Other   . Colon cancer Neg Hx   . Prostate cancer Neg Hx   . Esophageal cancer Neg Hx   . Rectal cancer Neg Hx   . Stomach cancer Neg Hx     Allergies  Allergen Reactions  . Cat Hair Extract Other (See Comments)    Itchy eyes  . Dog Epithelium Itching  . Other Other (See Comments)    Itchy eyes    Current Outpatient Medications on File Prior to Visit  Medication Sig Dispense Refill  . aspirin EC 81 MG tablet Take 81 mg by mouth daily. On occasions    . fluticasone (FLONASE) 50 MCG/ACT nasal spray Place 1 spray into both nostrils daily. 16 g 2  . Glucosamine-Chondroitin-MSM (CONDROLITE) 500-200-150 MG TABS Take by mouth.    . hydrOXYzine (ATARAX/VISTARIL) 10 MG tablet 1-2 tab po q 8 hours as needed itching. 30 tablet 0  . Multiple Vitamin (MULTIVITAMIN) tablet Take 1 tablet by mouth daily.    . permethrin (ELIMITE) 5 % cream Apply head to feet. Wash off after 10 hours. Repeat in one week if necessary. 60 g 0  . levocetirizine (XYZAL) 5  MG tablet Take 1 tablet (5 mg total) by mouth every evening. 30 tablet 3   No current facility-administered medications on file prior to visit.     BP 112/80   Pulse 71   Temp (!) 96.5 F (35.8 C) (Temporal)   Ht 5\' 9"  (1.753 m)   Wt 226 lb (102.5 kg)   BMI 33.37 kg/m       Objective:   Physical Exam    General Mental Status- Alert. General Appearance- Not in acute distress.   Skin General: Color-  Normal Color. Moisture- Normal Moisture. No worrisome lesions.  Neck Carotid Arteries- Normal color. Moisture- Normal Moisture. No carotid bruits. No JVD.  Chest and Lung Exam Auscultation: Breath Sounds:-Normal.  Cardiovascular Auscultation:Rythm- Regular. Murmurs & Other Heart Sounds:Auscultation of the heart reveals- No Murmurs.  Abdomen Inspection:-Inspeection Normal. Palpation/Percussion:Note:No mass. Palpation and Percussion of the abdomen reveal- Non Tender, Non Distended + BS, no rebound or guarding.   Neurologic Cranial Nerve exam:- CN III-XII intact(No nystagmus), symmetric smile. Heal to Toe Gait exam:-Normal. Finger to Nose:- Normal/Intact Strength:- 5/5 equal and symmetric strength both upper and lower extremities.  HEENT Head- Normal. Ear Auditory Canal - Left- Normal. Right - Normal.Tympanic Membrane- Left- Normal. Right- Normal. Eye Sclera/Conjunctiva- Left- Normal. Right- Normal. Nose & Sinuses Nasal Mucosa- Left-  Boggy and Congested. Right-  Boggy and  Congested.Bilateral  No maxillary and No frontal sinus pressure. Mouth & Throat Lips: Upper Lip- Normal: no dryness, cracking, pallor, cyanosis, or vesicular eruption. Lower Lip-Normal: no dryness, cracking, pallor, cyanosis or vesicular eruption. Buccal Mucosa- Bilateral- No Aphthous ulcers. Oropharynx- No Discharge or Erythema. Tonsils: Characteristics- Bilateral- No Erythema or Congestion. Size/Enlargement- Bilateral- No enlargement. Discharge- bilateral-None.  Rt elbow-lateral epicondyle  tenderness to palpation. Mild discomfort on supination.           Assessment & Plan:  For you wellness exam today I have ordered cbc, cmp, psa and  lipid panel.  Flu vaccine today.   Recommend exercise and healthy diet.  We will let you know lab results as they come in.  Follow up date appointment will be determined after lab review.   For right elbow pain/probable lateral epicondylitis, I did go ahead and place referral for sports medicine.  You can use some low-dose ibuprofen during the interim to help deal with the discomfort.  Also mention your chronic right shoulder pain as he could evaluate that area as well.  For history of probable BPH, I did refill your Flomax.  Went ahead and referred you back to your former urologist.  For history of arthralgias worse in the right hand, I did place arthritis panel.  For mild fatigue, did add B12, B1 and TSH to your lab work.   For allergic rhinitis, I did prescribe Xyzal antihistamine and Flonase.  On discussion and evaluation I do not think that you have COVID type presentation.  But we need to be very cautious.  Recommend that you stay at home over the weekend.  If you do not have the clinical improvement by Monday then I would have you tested at [place] [facility] for [place] and write your work note pending test results.  Recommend try weight watchers app to help you lose weight.  Follow-up date to be determined after lab review.  [id number] charge as well as addressed all issues. Answered all questions as well.  Mackie Pai, PA-C

## 2019-07-19 ENCOUNTER — Encounter: Payer: Self-pay | Admitting: Medical

## 2019-07-20 ENCOUNTER — Ambulatory Visit: Payer: Self-pay

## 2019-07-20 ENCOUNTER — Ambulatory Visit: Payer: BC Managed Care – PPO | Admitting: Family Medicine

## 2019-07-20 ENCOUNTER — Encounter: Payer: Self-pay | Admitting: Family Medicine

## 2019-07-20 ENCOUNTER — Other Ambulatory Visit: Payer: Self-pay

## 2019-07-20 VITALS — BP 121/79 | Ht 69.0 in | Wt 222.0 lb

## 2019-07-20 DIAGNOSIS — G8929 Other chronic pain: Secondary | ICD-10-CM

## 2019-07-20 DIAGNOSIS — M25511 Pain in right shoulder: Secondary | ICD-10-CM

## 2019-07-20 DIAGNOSIS — M79644 Pain in right finger(s): Secondary | ICD-10-CM

## 2019-07-20 DIAGNOSIS — M7711 Lateral epicondylitis, right elbow: Secondary | ICD-10-CM

## 2019-07-20 MED ORDER — NITROGLYCERIN 0.2 MG/HR TD PT24: MEDICATED_PATCH | TRANSDERMAL | 11 refills | Status: DC

## 2019-07-20 MED ORDER — PENNSAID 2 % TD SOLN: 1.0000 "application " | Freq: Two times a day (BID) | TRANSDERMAL | 3 refills | Status: DC

## 2019-07-20 MED FILL — NITROGLYCERIN 0.2 MG/HR PTC: 0.2 | 84 days supply | Qty: 21 | Fill #0

## 2019-07-20 NOTE — Progress Notes (Signed)
Tony Paul - 58 y.o. male MRN [id number]  Date of birth: 1961-08-09  SUBJECTIVE:  Including CC & ROS.  Chief Complaint  Patient presents with  . Elbow Pain    right elbow  . Shoulder Pain    right shoulder    Tony Paul is a 58 y.o. male that is presenting with right shoulder pain, right elbow pain, and right finger pain.  The shoulder pain has been acute on chronic in nature.  He has experienced this pain for years.  He has pain over the lateral aspect.  He noticed some bruising in the posterior compartment when he was throwing the other day.  Denies any significant weakness.  Pain is mild and intermittent.  No prior surgery.  Experiencing pain at the lateral epicondyle.  Symptoms seem to be worse with racquet sports.  He likes to play pickle ball and other racquet sports.  Notices it with certain activities.  Has some swelling over the area.  No ecchymosis.  He is experiencing right finger pain.  He is experiencing this in the metacarpal and PIP joints.  It seems most stiff in the morning.  He denies any redness.  It gets better through the course the day if he tends to exercise his hand.  No inciting event.  No previous history of gout.  Has taken medications with some improvement.  Seems to be localized to the joint.  Independent review of the right hand x-ray from 9/25 shows no acute abnormality.   Review of Systems  Constitutional: Negative for fever.  HENT: Negative for congestion.   Respiratory: Negative for cough.   Cardiovascular: Negative for chest pain.  Gastrointestinal: Negative for abdominal pain.  Musculoskeletal: Negative for gait problem.  Skin: Negative for color change.  Neurological: Negative for weakness.  Hematological: Negative for adenopathy.    HISTORY: Past Medical, Surgical, Social, and Family History Reviewed & Updated per EMR.   Pertinent Historical Findings include:  Past Medical History:  Diagnosis Date  . colon ca dx'd 08/2012  . Depression  with anxiety 01/04/2013  . Headache 11/22/2016  . Health maintenance examination   . Heart murmur   . Heel pain, chronic, left 11/22/2016  . HLD (hyperlipidemia)   . Low back pain 11/22/2016  . Nocturia 04/09/2013  . Restless sleeper 04/09/2013  . Rosacea   . Skin cancer   . 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 COLON SURGERY  2013  . COLONOSCOPY    . LAPAROSCOPIC LOW ANTERIOR RESECTION  10/09/2012   Procedure: LAPAROSCOPIC LOW ANTERIOR RESECTION;  Surgeon: Leighton Ruff, MD;  Location: [facility];  Service: General;  Laterality: N/A;  . TONSILLECTOMY  1971    Allergies  Allergen Reactions  . Cat Hair Extract Other (See Comments)    Itchy eyes  . Dog Epithelium Itching  . Other Other (See Comments)    Itchy eyes    Family History  Problem Relation Age of Onset  . Heart failure Father   . Kidney disease Father   . Hypertension Other   . Hyperlipidemia Other   . Kidney disease Other   . Colon cancer Neg Hx   . Prostate cancer Neg Hx   . Esophageal cancer Neg Hx   . Rectal cancer Neg Hx   . Stomach cancer Neg Hx      Social History   Socioeconomic History  . Marital status: Married  Spouse name: Not on file  . Number of children: Not on file  . Years of education: Not on file  . Highest education level: Not on file  Occupational History  . Not on file  Social Needs  . Financial resource strain: Not on file  . Food insecurity    Worry: Not on file    Inability: Not on file  . Transportation needs    Medical: Not on file    Non-medical: Not on file  Tobacco Use  . Smoking status: Never Smoker  . Smokeless tobacco: Never Used  Substance and Sexual Activity  . Alcohol use: Yes    Alcohol/week: 1.0 standard drinks    Types: 1 Glasses of wine per week    Comment: some weeks none  . Drug use: No  . Sexual activity: Yes  Lifestyle  . Physical activity    Days per week: Not on file     Minutes per session: Not on file  . Stress: Not on file  Relationships  . Social Herbalist on phone: Not on file    Gets together: Not on file    Attends religious service: Not on file    Active member of club or organization: Not on file    Attends meetings of clubs or organizations: Not on file    Relationship status: Not on file  . Intimate partner violence    Fear of current or ex partner: Not on file    Emotionally abused: Not on file    Physically abused: Not on file    Forced sexual activity: Not on file  Other Topics Concern  . 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 Normal external and internal rotation. Negative empty can test. Mild pain with speeds test. Negative O'Brien's test. Right elbow: Tenderness palpation over lateral condyle. No effusion. Pain with resistance to middle finger extension. Pain with resisted supination and pronation. No redness. Right hand: No redness of the MCP joints or PIP joints. Normal finger flexion and extension. No malrotation or misalignment of the fingers. Normal wrist range of motion. Neurovascular intact  Limited ultrasound: Right shoulder, right elbow, right hand:  Right shoulder: Biceps tendon appears to have a cystic structure most proximal to the origin.  Does not appear to be an effusion. Normal-appearing subscapularis.  Normal-appearing supraspinatus and static and dynamic testing. Posterior glenohumeral joint with degenerative labral changes and suggestion of a small effusion.  Right elbow: No effusion of the elbow joint. Supination pronation of the radial head. Normal appearance and long axis of the common extensors at the origin with no vascular uptake.  In short axis there appears to be mild mucus changes.  Right hand: There appears to be increased synovitis of the second and third MCP joints. No significant effusion in these  areas.  There appears to be possible hyperechoic reflections to suggest gout  Summary: Right shoulder with possible para labral cyst.  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  Right hand with possible synovitis which could be related to osteoarthritis.  Ultrasound and interpretation by Clearance Coots, MD       ASSESSMENT & PLAN:   Lateral epicondylitis of right elbow Symptoms seem most consistent with lateral epicondylitis.  Appears to be chronic. -Nitro patches.  Counseled on the use. -Counseled on home exercise therapy and supportive care. -If no improvement can consider injection versus physical therapy.  Pain of finger of right hand There appears to be synovitis of the second and third MCP joints.  Could be related to osteoarthritis versus gout. -Pennsaid. -Uric acid. -If unrevealing could consider further rheumatologic testing.  Chronic right shoulder pain There appears to be a cyst in the proximal biceps tendon.  This could be related to degenerative changes of the labrum.  Rotator cuff looks good and is strong. -Counseled on home exercise therapy and supportive care. -If no improvement can consider injection and imaging or physical therapy.

## 2019-07-20 NOTE — Assessment & Plan Note (Signed)
There appears to be a cyst in the proximal biceps tendon.  This could be related to degenerative changes of the labrum.  Rotator cuff looks good and is strong. -Counseled on home exercise therapy and supportive care. -If no improvement can consider injection and imaging or physical therapy.

## 2019-07-20 NOTE — Assessment & Plan Note (Signed)
There appears to be synovitis of the second and third MCP joints.  Could be related to osteoarthritis versus gout. -Pennsaid. -Uric acid. -If unrevealing could consider further rheumatologic testing.

## 2019-07-20 NOTE — Patient Instructions (Signed)
Nice to meet you Please try the exercises for the shoulder   Please try the nitro patches for the elbow. Please let me know if you don't tolerate them.  Please try the rub on medicine for the fingers I will call you back with the result from today  Please send me a message in MyChart with any questions or updates.  Please see me back in 4 weeks.   --Dr. Raeford Razor

## 2019-07-20 NOTE — Assessment & Plan Note (Signed)
Symptoms seem most consistent with lateral epicondylitis.  Appears to be chronic. -Nitro patches.  Counseled on the use. -Counseled on home exercise therapy and supportive care. -If no improvement can consider injection versus physical therapy.

## 2019-07-21 ENCOUNTER — Telehealth: Payer: Self-pay | Admitting: Family Medicine

## 2019-07-21 ENCOUNTER — Encounter: Payer: Self-pay | Admitting: Medical

## 2019-07-21 ENCOUNTER — Telehealth: Payer: Self-pay | Admitting: Medical

## 2019-07-21 ENCOUNTER — Telehealth: Payer: Self-pay

## 2019-07-21 DIAGNOSIS — M2550 Pain in unspecified joint: Secondary | ICD-10-CM

## 2019-07-21 DIAGNOSIS — R768 Other specified abnormal immunological findings in serum: Secondary | ICD-10-CM

## 2019-07-21 LAB — TEST AUTHORIZATION

## 2019-07-21 LAB — HEPATITIS C ANTIBODY
Hepatitis C Ab: NONREACTIVE
SIGNAL TO CUT-OFF: 0.01 (ref <1.00)

## 2019-07-21 LAB — ANTI-NUCLEAR AB-TITER (ANA TITER)
ANA TITER: 1:80 {titer} — ABNORMAL HIGH
ANA Titer 1: 1:80 {titer} — ABNORMAL HIGH

## 2019-07-21 LAB — URIC ACID: Uric Acid: 6.5 mg/dL (ref 3.7–8.6)

## 2019-07-21 LAB — VITAMIN B1: Vitamin B1 (Thiamine): 24 nmol/L (ref 8–30)

## 2019-07-21 LAB — ANA: Anti Nuclear Antibody (ANA): POSITIVE — AB

## 2019-07-21 LAB — RHEUMATOID FACTOR: Rheumatoid fact SerPl-aCnc: <14 IU/mL (ref <14)

## 2019-07-21 NOTE — Telephone Encounter (Signed)
Spoke with patient about uric acid. Discussed medication lower agents. Will hold off for now. Will try changing diet and exercise for and re-check.   Rosemarie Ax, MD [facility] 07/21/2019, 3:24 PM

## 2019-07-21 NOTE — Telephone Encounter (Signed)
PA initiated via Covermymeds; KEY: [id number]. Awaiting determination.

## 2019-07-22 NOTE — Telephone Encounter (Signed)
PA approved. Effective from 07/21/2019 through 07/19/2022

## 2019-07-22 NOTE — Telephone Encounter (Signed)
Gwen has sent to [facility] and waiting for appt.

## 2019-07-23 ENCOUNTER — Ambulatory Visit: Payer: BC Managed Care – PPO

## 2019-07-27 ENCOUNTER — Other Ambulatory Visit: Payer: Self-pay

## 2019-07-27 DIAGNOSIS — Z20822 Contact with and (suspected) exposure to covid-19: Secondary | ICD-10-CM

## 2019-07-27 DIAGNOSIS — Z20828 Contact with and (suspected) exposure to other viral communicable diseases: Secondary | ICD-10-CM | POA: Diagnosis not present

## 2019-07-28 LAB — NOVEL CORONAVIRUS, NAA: SARS-CoV-2, NAA: NOT DETECTED

## 2019-08-03 DIAGNOSIS — R3912 Poor urinary stream: Secondary | ICD-10-CM | POA: Diagnosis not present

## 2019-08-03 DIAGNOSIS — N401 Enlarged prostate with lower urinary tract symptoms: Secondary | ICD-10-CM | POA: Diagnosis not present

## 2019-08-03 DIAGNOSIS — R3915 Urgency of urination: Secondary | ICD-10-CM | POA: Diagnosis not present

## 2019-08-17 ENCOUNTER — Ambulatory Visit: Payer: BC Managed Care – PPO | Admitting: Family Medicine

## 2019-08-21 MED FILL — TAMSULOSIN HCL 0.4 MG CAP: 0.4 | 30 days supply | Qty: 30 | Fill #1

## 2019-08-21 MED FILL — LEVOCETIRIZINE 5 MG TABLET: 5 | 30 days supply | Qty: 30 | Fill #1

## 2019-09-21 ENCOUNTER — Ambulatory Visit: Payer: BC Managed Care – PPO | Admitting: Family Medicine

## 2019-09-21 ENCOUNTER — Other Ambulatory Visit: Payer: Self-pay

## 2019-09-21 ENCOUNTER — Encounter: Payer: Self-pay | Admitting: Family Medicine

## 2019-09-21 VITALS — BP 139/87 | HR 83 | Ht 69.0 in | Wt 222.0 lb

## 2019-09-21 DIAGNOSIS — M222X2 Patellofemoral disorders, left knee: Secondary | ICD-10-CM | POA: Insufficient documentation

## 2019-09-21 DIAGNOSIS — M7711 Lateral epicondylitis, right elbow: Secondary | ICD-10-CM

## 2019-09-21 DIAGNOSIS — Z20822 Contact with and (suspected) exposure to covid-19: Secondary | ICD-10-CM

## 2019-09-21 MED FILL — FLUTICASONE PROP 50 MCG SPR: 50 | 30 days supply | Qty: 16 | Fill #1

## 2019-09-21 MED FILL — LEVOCETIRIZINE 5 MG TABLET: 5 | 30 days supply | Qty: 30 | Fill #2

## 2019-09-21 MED FILL — TAMSULOSIN HCL 0.4 MG CAP: 0.4 | 30 days supply | Qty: 30 | Fill #2

## 2019-09-21 NOTE — Progress Notes (Signed)
Tony Paul - 58 y.o. male MRN [id number]  Date of birth: May 03, 1961  SUBJECTIVE:  Including CC & ROS.  Chief Complaint  Patient presents with  . Follow-up    follow up for right shoulder    Tony Paul is a 58 y.o. male that is following up for his right elbow pain.  He is presenting with acute left knee pain.  Left knee is worse when he is walking down stairs.  He exercises [place]  The pain is anterior nature.  It is intermittent in nature.  It is moderate.  It is stabbing at times.  Denies any trauma.  Denies any mechanical symptoms.  He feels like his elbow pain is mild at the moment.  It is localized to the lateral epicondyle.  He has not been playing pickle ball or any other racquet sports in order to avoid exacerbating the pain.  He has been avoiding this cough as well.  Symptoms are sharp and stabbing when they do occur.  They are occurring intermittently.  There are some radiation down to the hand.   Review of Systems  Constitutional: Negative for fever.  HENT: Negative for congestion.   Respiratory: Negative for cough.   Cardiovascular: Negative for chest pain.  Gastrointestinal: Negative for abdominal pain.  Musculoskeletal: Negative for back pain.  Skin: Negative for color change.  Neurological: Negative for weakness.  Hematological: Negative for adenopathy.    HISTORY: Past Medical, Surgical, Social, and Family History Reviewed & Updated per EMR.   Pertinent Historical Findings include:  Past Medical History:  Diagnosis Date  . colon ca dx'd 08/2012  . Depression with anxiety 01/04/2013  . Headache 11/22/2016  . Health maintenance examination   . Heart murmur   . Heel pain, chronic, left 11/22/2016  . HLD (hyperlipidemia)   . Low back pain 11/22/2016  . Nocturia 04/09/2013  . Restless sleeper 04/09/2013  . Rosacea   . Skin cancer   . 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 COLON SURGERY  2013  . COLONOSCOPY    . LAPAROSCOPIC LOW ANTERIOR RESECTION  10/09/2012   Procedure: LAPAROSCOPIC LOW ANTERIOR RESECTION;  Surgeon: Leighton Ruff, MD;  Location: [facility];  Service: General;  Laterality: N/A;  . TONSILLECTOMY  1971    Allergies  Allergen Reactions  . Cat Hair Extract Other (See Comments)    Itchy eyes  . Dog Epithelium Itching  . Other Other (See Comments)    Itchy eyes    Family History  Problem Relation Age of Onset  . Heart failure Father   . Kidney disease Father   . Hypertension Other   . Hyperlipidemia Other   . Kidney disease Other   . Colon cancer Neg Hx   . Prostate cancer Neg Hx   . Esophageal cancer Neg Hx   . Rectal cancer Neg Hx   . Stomach cancer Neg Hx      Social History   Socioeconomic History  . Marital status: Married    Spouse name: Not on file  . Number of children: Not on file  . Years of education: Not on file  . Highest education level: Not on file  Occupational History  . Not on file  Social Needs  . Financial resource strain: Not on file  . Food insecurity    Worry: Not on file  Inability: Not on file  . Transportation needs    Medical: Not on file    Non-medical: Not on file  Tobacco Use  . Smoking status: Never Smoker  . Smokeless tobacco: Never Used  Substance and Sexual Activity  . Alcohol use: Yes    Alcohol/week: 1.0 standard drinks    Types: 1 Glasses of wine per week    Comment: some weeks none  . Drug use: No  . Sexual activity: Yes  Lifestyle  . Physical activity    Days per week: Not on file    Minutes per session: Not on file  . Stress: Not on file  Relationships  . Social Herbalist on phone: Not on file    Gets together: Not on file    Attends religious service: Not on file    Active member of club or organization: Not on file    Attends meetings of clubs or organizations: Not on file    Relationship status: Not on file  . Intimate  partner violence    Fear of current or ex partner: Not on file    Emotionally abused: Not on file    Physically abused: Not on file    Forced sexual activity: Not on file  Other Topics Concern  . Not on file  Social History Narrative   Charity fundraiser for [employer]   Married 30 years   3 sons 21, 25,29     PHYSICAL EXAM:  VS: BP 139/87   Pulse 83   Ht 5\' 9"  (1.753 m)   Wt 222 lb (100.7 kg)   BMI 32.78 kg/m  Physical Exam Gen: NAD, alert, cooperative with exam, well-appearing ENT: normal lips, normal nasal mucosa,  Eye: normal EOM, normal conjunctiva and lids CV:  no edema, +2 pedal pulses   Resp: no accessory muscle use, non-labored,   Skin: no rashes, no areas of induration  Neuro: normal tone, normal sensation to touch Psych:  normal insight, alert and oriented MSK:  Right elbow: Tenderness palpation over lateral epicondyle. Normal range of motion. No pain with middle finger extension and resistance. Pain exacerbated with resistance to wrist extension. No instability. Left knee: No obvious effusion. No tenderness palpation over the femoral condyles or the lateral joint line.  Tenderness palpation over the medial joint line. No instability valgus or varus stress testing. Negative McMurray's test. No pain with patellar grind. Neurovascularly intact     ASSESSMENT & PLAN:   Lateral epicondylitis of right elbow Pain is improved but still slightly occurring.  Has tried nitroglycerin patches.  Counseled on possible injection but return of pain. -Continue nitro patches. -Counseled on compression. -Counseled on home exercise therapy and supportive care. -Could always consider steroid injection or PRP.  Could consider physical therapy.  Patellofemoral pain syndrome of left knee Symptoms seem most consistent with patellofemoral syndrome.  He experiences most with walking down the stairs. -Counseled on exercise therapy and supportive care. -Can consider physical  therapy or imaging.

## 2019-09-21 NOTE — Assessment & Plan Note (Signed)
Symptoms seem most consistent with patellofemoral syndrome.  He experiences most with walking down the stairs. -Counseled on exercise therapy and supportive care. -Can consider physical therapy or imaging.

## 2019-09-21 NOTE — Patient Instructions (Signed)
Good to see you Please try the exercises for the knee and elbow  Please try ice  Please try compression   Please send me a message in MyChart with any questions or updates.  Please see [place] back in 2 months or as needed.   --Dr. Raeford Razor

## 2019-09-21 NOTE — Assessment & Plan Note (Signed)
Pain is improved but still slightly occurring.  Has tried nitroglycerin patches.  Counseled on possible injection but return of pain. -Continue nitro patches. -Counseled on compression. -Counseled on home exercise therapy and supportive care. -Could always consider steroid injection or PRP.  Could consider physical therapy.

## 2019-09-22 ENCOUNTER — Encounter: Payer: Self-pay | Admitting: Family Medicine

## 2019-09-22 LAB — NOVEL CORONAVIRUS, NAA: SARS-CoV-2, NAA: DETECTED — AB

## 2019-09-23 MED FILL — PENNSAID 2% PUMP: 2 | 56 days supply | Qty: 112 | Fill #0

## 2019-10-26 MED FILL — TAMSULOSIN HCL 0.4 MG CAP: 0.4 | 30 days supply | Qty: 30 | Fill #3

## 2019-10-26 MED FILL — LEVOCETIRIZINE 5 MG TABLET: 5 | 30 days supply | Qty: 30 | Fill #3

## 2019-10-30 DIAGNOSIS — L280 Lichen simplex chronicus: Secondary | ICD-10-CM | POA: Diagnosis not present

## 2019-10-30 DIAGNOSIS — D1801 Hemangioma of skin and subcutaneous tissue: Secondary | ICD-10-CM | POA: Diagnosis not present

## 2019-10-30 DIAGNOSIS — D485 Neoplasm of uncertain behavior of skin: Secondary | ICD-10-CM | POA: Diagnosis not present

## 2019-10-30 DIAGNOSIS — D2239 Melanocytic nevi of other parts of face: Secondary | ICD-10-CM | POA: Diagnosis not present

## 2019-10-30 DIAGNOSIS — D225 Melanocytic nevi of trunk: Secondary | ICD-10-CM | POA: Diagnosis not present

## 2019-10-30 DIAGNOSIS — Z85820 Personal history of malignant melanoma of skin: Secondary | ICD-10-CM | POA: Diagnosis not present

## 2019-10-30 DIAGNOSIS — L718 Other rosacea: Secondary | ICD-10-CM | POA: Diagnosis not present

## 2019-10-30 DIAGNOSIS — B079 Viral wart, unspecified: Secondary | ICD-10-CM | POA: Diagnosis not present

## 2019-12-03 ENCOUNTER — Other Ambulatory Visit: Payer: Self-pay | Admitting: Medical

## 2019-12-03 MED FILL — LEVOCETIRIZINE 5 MG TABLET: 5 | 30 days supply | Qty: 30 | Fill #0

## 2019-12-03 MED FILL — TAMSULOSIN HCL 0.4 MG CAP: 0.4 | 30 days supply | Qty: 30 | Fill #0

## 2019-12-09 DIAGNOSIS — Z03818 Encounter for observation for suspected exposure to other biological agents ruled out: Secondary | ICD-10-CM | POA: Diagnosis not present

## 2019-12-09 DIAGNOSIS — Z20828 Contact with and (suspected) exposure to other viral communicable diseases: Secondary | ICD-10-CM | POA: Diagnosis not present

## 2020-01-13 DIAGNOSIS — Z20828 Contact with and (suspected) exposure to other viral communicable diseases: Secondary | ICD-10-CM | POA: Diagnosis not present

## 2020-02-03 MED FILL — LEVOCETIRIZINE 5 MG TABLET: 5 | 30 days supply | Qty: 30 | Fill #1

## 2020-02-03 MED FILL — TAMSULOSIN HCL 0.4 MG CAP: 0.4 | 30 days supply | Qty: 30 | Fill #1

## 2020-03-02 ENCOUNTER — Encounter: Payer: Self-pay | Admitting: Family Medicine

## 2020-03-03 ENCOUNTER — Other Ambulatory Visit: Payer: Self-pay | Admitting: Family Medicine

## 2020-03-03 DIAGNOSIS — R350 Frequency of micturition: Secondary | ICD-10-CM

## 2020-03-03 DIAGNOSIS — N400 Enlarged prostate without lower urinary tract symptoms: Secondary | ICD-10-CM

## 2020-03-17 DIAGNOSIS — D485 Neoplasm of uncertain behavior of skin: Secondary | ICD-10-CM | POA: Diagnosis not present

## 2020-03-17 DIAGNOSIS — D2239 Melanocytic nevi of other parts of face: Secondary | ICD-10-CM | POA: Diagnosis not present

## 2020-07-01 DIAGNOSIS — R3915 Urgency of urination: Secondary | ICD-10-CM | POA: Diagnosis not present

## 2020-07-01 DIAGNOSIS — R3912 Poor urinary stream: Secondary | ICD-10-CM | POA: Diagnosis not present

## 2020-07-01 DIAGNOSIS — N401 Enlarged prostate with lower urinary tract symptoms: Secondary | ICD-10-CM | POA: Diagnosis not present

## 2020-07-15 DIAGNOSIS — R3915 Urgency of urination: Secondary | ICD-10-CM | POA: Diagnosis not present

## 2020-07-15 DIAGNOSIS — N401 Enlarged prostate with lower urinary tract symptoms: Secondary | ICD-10-CM | POA: Diagnosis not present

## 2020-07-15 DIAGNOSIS — R3912 Poor urinary stream: Secondary | ICD-10-CM | POA: Diagnosis not present

## 2020-07-18 MED FILL — levoFLOXacin 750 MG TABS: 750 | 1 days supply | Qty: 1 | Fill #0

## 2020-09-19 DIAGNOSIS — R3915 Urgency of urination: Secondary | ICD-10-CM | POA: Diagnosis not present

## 2020-09-19 DIAGNOSIS — N401 Enlarged prostate with lower urinary tract symptoms: Secondary | ICD-10-CM | POA: Diagnosis not present

## 2020-11-04 DIAGNOSIS — N401 Enlarged prostate with lower urinary tract symptoms: Secondary | ICD-10-CM | POA: Diagnosis not present

## 2020-11-04 DIAGNOSIS — R3915 Urgency of urination: Secondary | ICD-10-CM | POA: Diagnosis not present

## 2020-11-04 DIAGNOSIS — R3912 Poor urinary stream: Secondary | ICD-10-CM | POA: Diagnosis not present

## 2021-02-18 DIAGNOSIS — Z20822 Contact with and (suspected) exposure to covid-19: Secondary | ICD-10-CM | POA: Diagnosis not present

## 2021-03-17 ENCOUNTER — Other Ambulatory Visit (HOSPITAL_BASED_OUTPATIENT_CLINIC_OR_DEPARTMENT_OTHER): Payer: Self-pay

## 2021-03-17 ENCOUNTER — Telehealth (INDEPENDENT_AMBULATORY_CARE_PROVIDER_SITE_OTHER): Payer: BC Managed Care – PPO | Admitting: Family

## 2021-03-17 ENCOUNTER — Other Ambulatory Visit: Payer: Self-pay

## 2021-03-17 ENCOUNTER — Encounter: Payer: Self-pay | Admitting: Family

## 2021-03-17 VITALS — Ht 69.0 in | Wt 217.0 lb

## 2021-03-17 DIAGNOSIS — R059 Cough, unspecified: Secondary | ICD-10-CM

## 2021-03-17 DIAGNOSIS — U071 COVID-19: Secondary | ICD-10-CM | POA: Diagnosis not present

## 2021-03-17 MED ORDER — BENZONATATE 100 MG PO CAPS
100.0000 mg | ORAL_CAPSULE | Freq: Three times a day (TID) | ORAL | 0 refills | Status: DC | PRN
  Filled 2021-03-17: qty 20, 7d supply, fill #0

## 2021-03-17 MED ORDER — DOXYCYCLINE HYCLATE 100 MG PO TABS
100.0000 mg | ORAL_TABLET | Freq: Two times a day (BID) | ORAL | 0 refills | Status: DC
  Filled 2021-03-17: qty 14, 7d supply, fill #0

## 2021-03-17 MED ORDER — PREDNISONE 20 MG PO TABS
20.0000 mg | ORAL_TABLET | Freq: Every day | ORAL | 0 refills | Status: DC
  Filled 2021-03-17: qty 5, 5d supply, fill #0

## 2021-03-17 NOTE — Progress Notes (Signed)
Tony Paul is a 60 y.o. male with the following history as recorded in EpicCare:  Patient Active Problem List   Diagnosis Date Noted  . Patellofemoral pain syndrome of left knee 09/21/2019  . Chronic right shoulder pain 07/20/2019  . Pain of finger of right hand 07/20/2019  . Lateral epicondylitis of right elbow 07/20/2019  . Hyperglycemia 03/26/2018  . Urinary frequency 03/26/2018  . Obesity 03/24/2018  . Arthritis 03/24/2018  . Low back pain 11/22/2016  . Heel pain, chronic, left 11/22/2016  . Headache 11/22/2016  . Skin cancer   . Sinusitis 09/26/2013  . Nocturia 04/09/2013  . Restless sleeper 04/09/2013  . Depression with anxiety 01/04/2013  . Colon cancer ([place]) 10/10/2012  . Annual physical exam 06/26/2011  . Hyperlipidemia 04/22/2009  . ROSACEA 04/22/2009    Current Outpatient Medications  Medication Sig Dispense Refill  . benzonatate (TESSALON PERLES) 100 MG capsule Take 1 capsule (100 mg total) by mouth 3 (three) times daily as needed for cough. 20 capsule 0  . doxycycline (VIBRA-TABS) 100 MG tablet Take 1 tablet (100 mg total) by mouth 2 (two) times daily. 14 tablet 0  . fluticasone (FLONASE) 50 MCG/ACT nasal spray Place 1 spray into both nostrils daily. 16 g 2  . Multiple Vitamin (MULTIVITAMIN) tablet Take 1 tablet by mouth daily.    . predniSONE (DELTASONE) 20 MG tablet Take 1 tablet (20 mg total) by mouth daily with breakfast. 5 tablet 0  . aspirin EC 81 MG tablet Take 81 mg by mouth daily. On occasions (Patient not taking: Reported on 03/17/2021)    . Diclofenac Sodium (PENNSAID) 2 % SOLN Place 1 application onto the skin 2 (two) times daily. (Patient not taking: Reported on 03/17/2021) 112 g 3  . Glucosamine-Chondroitin-MSM (CONDROLITE) 500-200-150 MG TABS Take by mouth. (Patient not taking: Reported on 03/17/2021)    . hydrOXYzine (ATARAX/VISTARIL) 10 MG tablet 1-2 tab po q 8 hours as needed itching. (Patient not taking: Reported on 03/17/2021) 30 tablet 0  .  levocetirizine (XYZAL) 5 MG tablet TAKE 1 TABLET BY MOUTH EVERY EVENING (Patient not taking: Reported on 03/17/2021) 30 tablet 3  . nitroGLYCERIN (NITRODUR - DOSED IN MG/24 HR) 0.2 mg/hr patch Cut and apply 1/4 patch to most painful area q24h. (Patient not taking: Reported on 03/17/2021) 30 patch 11  . tamsulosin (FLOMAX) 0.4 MG CAPS capsule TAKE 1 CAPSULE BY MOUTH ONCE DAILY (Patient not taking: Reported on 03/17/2021) 30 capsule 3   No current facility-administered medications for this visit.    Allergies: Cat hair extract, Dog epithelium, and Other  Past Medical History:  Diagnosis Date  . colon ca dx'd 08/2012  . Depression with anxiety 01/04/2013  . Headache 11/22/2016  . Health maintenance examination   . Heart murmur   . Heel pain, chronic, left 11/22/2016  . HLD (hyperlipidemia)   . Low back pain 11/22/2016  . Nocturia 04/09/2013  . Restless sleeper 04/09/2013  . Rosacea   . Skin cancer   . 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 COLON SURGERY  2013  . COLONOSCOPY    . LAPAROSCOPIC LOW ANTERIOR RESECTION  10/09/2012   Procedure: LAPAROSCOPIC LOW ANTERIOR RESECTION;  Surgeon: Leighton Ruff, MD;  Location: [facility];  Service: General;  Laterality: N/A;  . TONSILLECTOMY  1971    Family History  Problem Relation Age of Onset  . Heart failure Father   . Kidney disease  Father   . Hypertension Other   . Hyperlipidemia Other   . Kidney disease Other   . Colon cancer Neg Hx   . Prostate cancer Neg Hx   . Esophageal cancer Neg Hx   . Rectal cancer Neg Hx   . Stomach cancer Neg Hx     Social History   Tobacco Use  . Smoking status: Never Smoker  . Smokeless tobacco: Never Used  Substance Use Topics  . Alcohol use: Yes    Alcohol/week: 1.0 standard drink    Types: 1 Glasses of wine per week    Comment: some weeks none    Subjective:   I connected with Tony Paul on 03/17/21 at 11:20 AM EDT by a video  enabled telemedicine application and verified that I am speaking with the correct person using two identifiers.   I discussed the limitations of evaluation and management by telemedicine and the availability of in person appointments. The patient expressed understanding and agreed to proceed. Provider in office/ patient is at home; provider and patient are only 2 people on video call.   Cough/ congestion x 6-7 days; experiencing chills/ body aches/ productive cough starting last weekend; difficult to differentiate from him "normal seasonal allergies." Took home COVID test yesterday to "be sure" and it was positive;    Objective:  Vitals:   03/17/21 1126  Weight: 217 lb (98.4 kg)  Height: 5\' 9"  (1.753 m)    General: Well developed, well nourished, in no acute distress  Skin : Warm and dry.  Head: Normocephalic and atraumatic  Lungs: Respirations unlabored;  Neurologic: Alert and oriented; speech intact; face symmetrical;   Assessment:  1. COVID-19   2. Cough     Plan:  Patient is most likely past 5 day onset of symptoms so do not feel eligible for oral anti-virals; will treat for secondary bacterial infection with Doxycycline, Prednisone and Tessalon Perles; increase fluids, rest and follow up worse, no better.    No follow-ups on file.  No orders of the defined types were placed in this encounter.   Requested Prescriptions   Signed Prescriptions Disp Refills  . doxycycline (VIBRA-TABS) 100 MG tablet 14 tablet 0    Sig: Take 1 tablet (100 mg total) by mouth 2 (two) times daily.  . predniSONE (DELTASONE) 20 MG tablet 5 tablet 0    Sig: Take 1 tablet (20 mg total) by mouth daily with breakfast.  . benzonatate (TESSALON PERLES) 100 MG capsule 20 capsule 0    Sig: Take 1 capsule (100 mg total) by mouth 3 (three) times daily as needed for cough.

## 2021-07-12 DIAGNOSIS — H6091 Unspecified otitis externa, right ear: Secondary | ICD-10-CM | POA: Diagnosis not present

## 2021-07-12 DIAGNOSIS — J309 Allergic rhinitis, unspecified: Secondary | ICD-10-CM | POA: Diagnosis not present

## 2021-07-17 DIAGNOSIS — R0981 Nasal congestion: Secondary | ICD-10-CM | POA: Diagnosis not present

## 2021-07-17 DIAGNOSIS — J309 Allergic rhinitis, unspecified: Secondary | ICD-10-CM | POA: Diagnosis not present

## 2021-07-17 DIAGNOSIS — H6091 Unspecified otitis externa, right ear: Secondary | ICD-10-CM | POA: Diagnosis not present

## 2021-08-24 IMAGING — DX DG HAND 2V*R*
2 series · 2 of 2 positions shown · non-contrast
Comparison: None.

CLINICAL DATA: Right hand pain for 1 year with no injury.

EXAM:
RIGHT HAND - 2 VIEW

[hand pa]
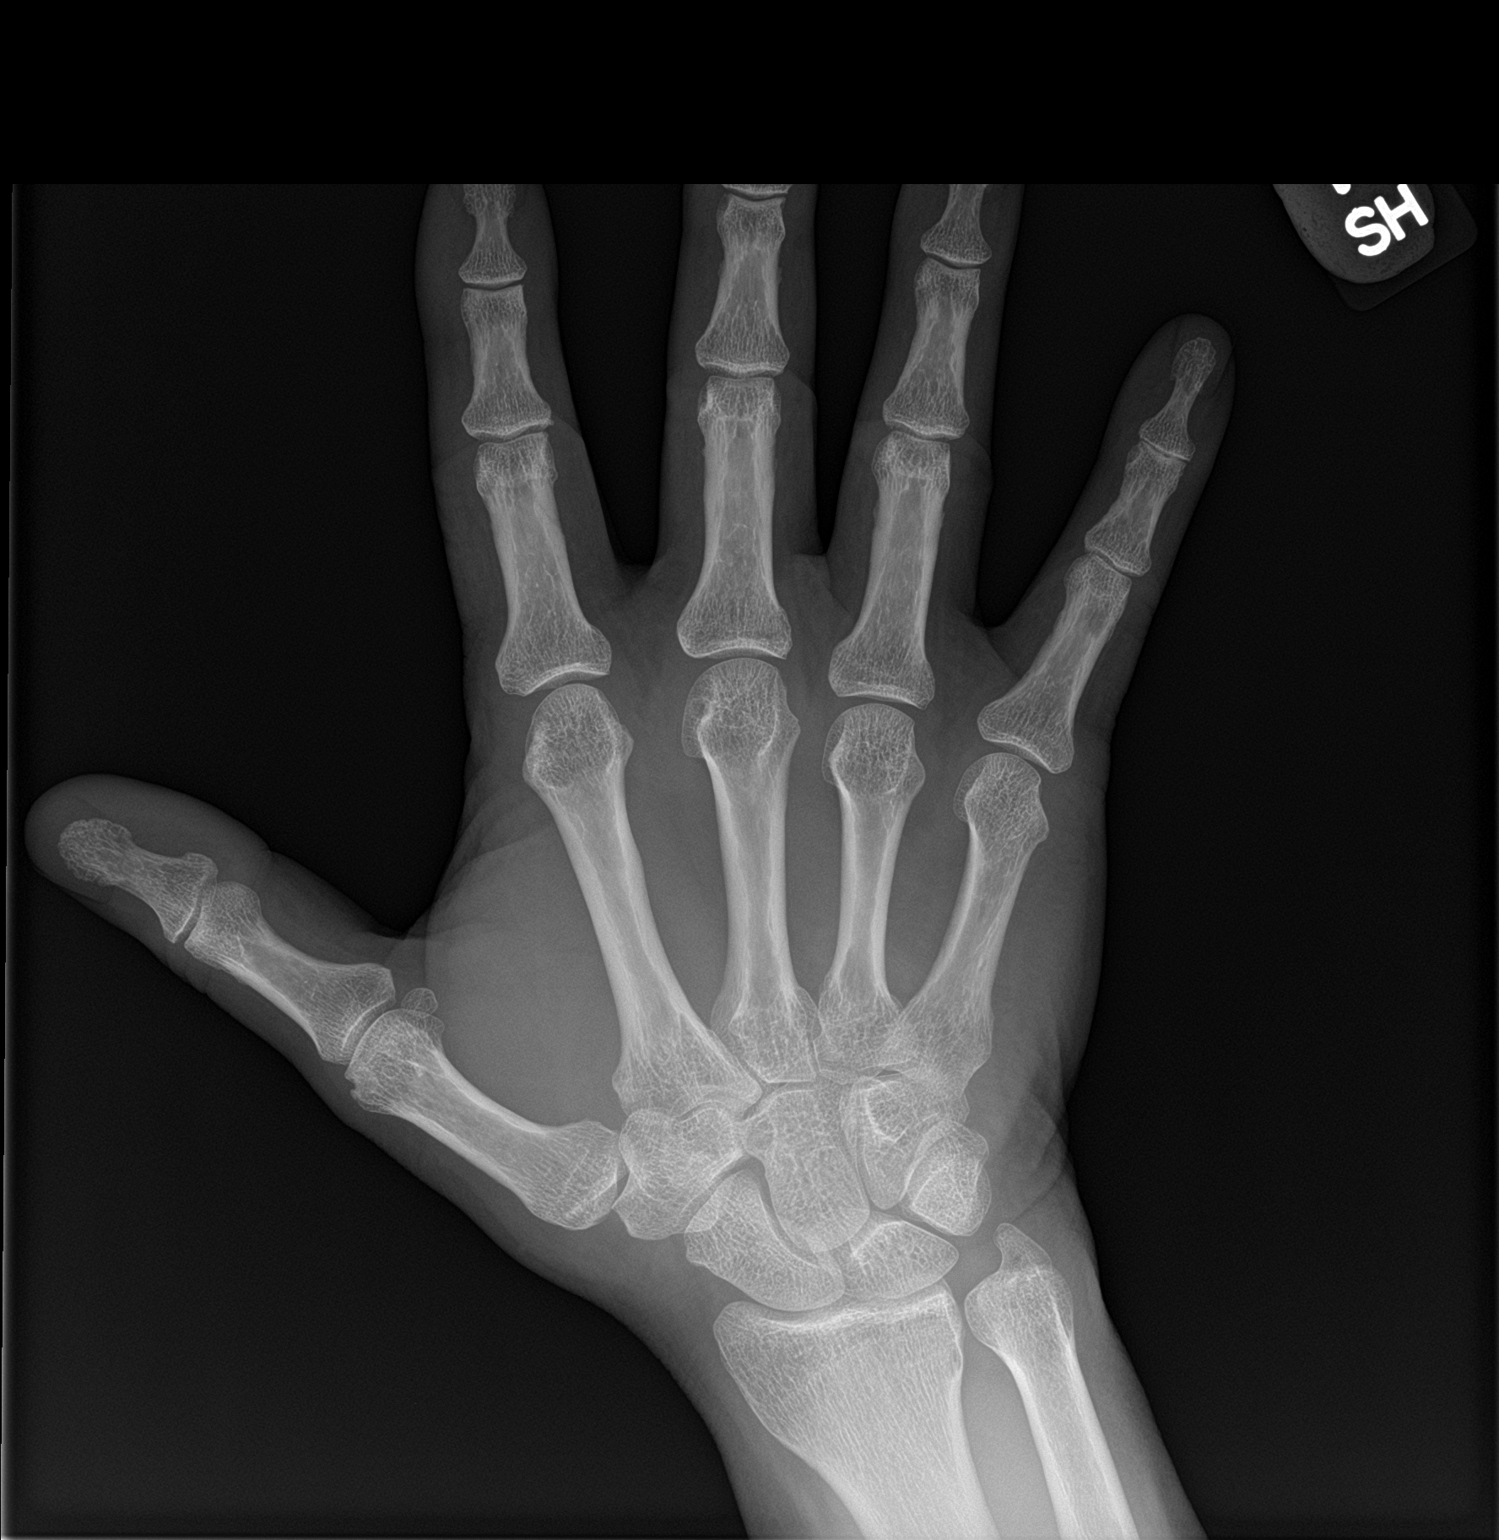

[hand lat]
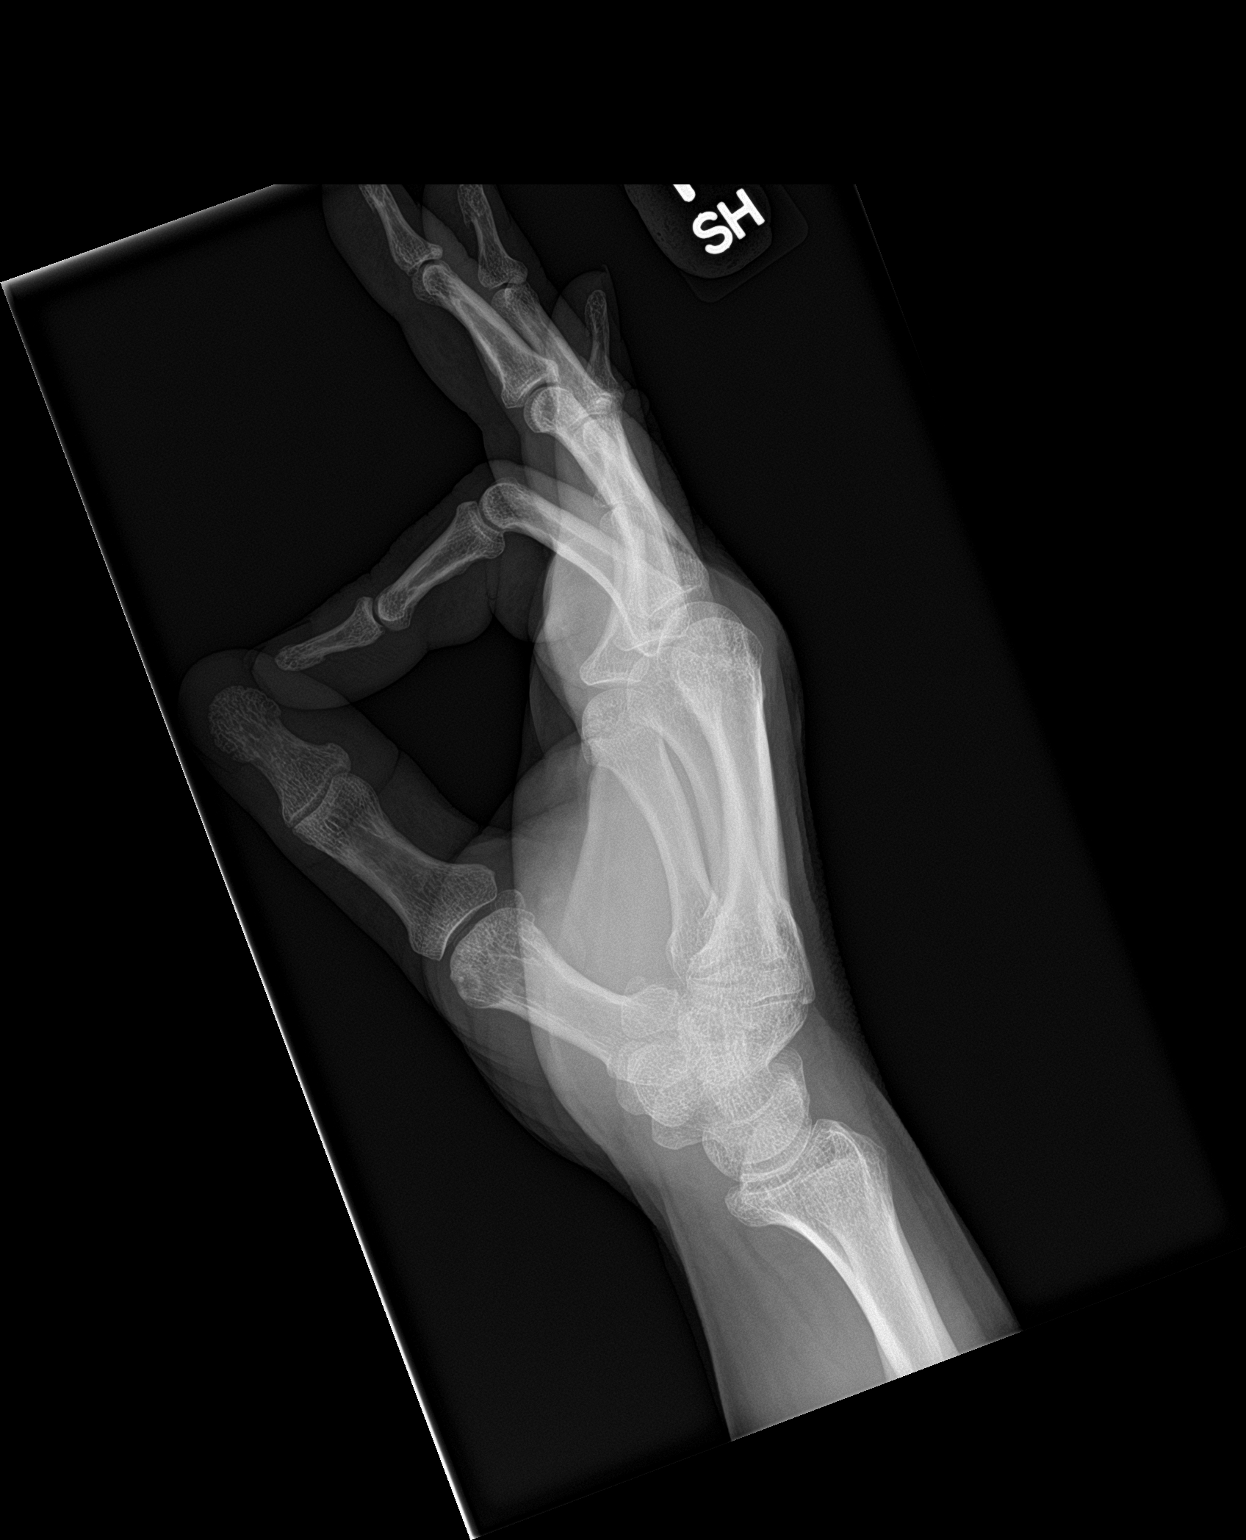

[2 of 2 positions shown; findings below may reference images not displayed]

FINDINGS: There is no evidence of fracture or dislocation. There is no
evidence of arthropathy or other focal bone abnormality. Soft
tissues are unremarkable.
IMPRESSION: Negative.

## 2021-10-08 ENCOUNTER — Encounter: Payer: Self-pay | Admitting: Internal Medicine

## 2021-11-06 ENCOUNTER — Encounter: Payer: BC Managed Care – PPO | Admitting: Family Medicine

## 2021-11-10 ENCOUNTER — Other Ambulatory Visit (HOSPITAL_BASED_OUTPATIENT_CLINIC_OR_DEPARTMENT_OTHER): Payer: Self-pay

## 2021-11-10 ENCOUNTER — Encounter: Payer: Self-pay | Admitting: Family

## 2021-11-10 ENCOUNTER — Ambulatory Visit (INDEPENDENT_AMBULATORY_CARE_PROVIDER_SITE_OTHER): Payer: BC Managed Care – PPO | Admitting: Family

## 2021-11-10 VITALS — BP 135/90 | HR 71 | Temp 98.0°F | Resp 16 | Ht 69.0 in | Wt 226.0 lb

## 2021-11-10 DIAGNOSIS — R109 Unspecified abdominal pain: Secondary | ICD-10-CM

## 2021-11-10 DIAGNOSIS — E785 Hyperlipidemia, unspecified: Secondary | ICD-10-CM | POA: Diagnosis not present

## 2021-11-10 DIAGNOSIS — Z125 Encounter for screening for malignant neoplasm of prostate: Secondary | ICD-10-CM | POA: Diagnosis not present

## 2021-11-10 DIAGNOSIS — Z0000 Encounter for general adult medical examination without abnormal findings: Secondary | ICD-10-CM | POA: Diagnosis not present

## 2021-11-10 DIAGNOSIS — L719 Rosacea, unspecified: Secondary | ICD-10-CM

## 2021-11-10 DIAGNOSIS — R030 Elevated blood-pressure reading, without diagnosis of hypertension: Secondary | ICD-10-CM | POA: Diagnosis not present

## 2021-11-10 DIAGNOSIS — Z1211 Encounter for screening for malignant neoplasm of colon: Secondary | ICD-10-CM

## 2021-11-10 DIAGNOSIS — Z23 Encounter for immunization: Secondary | ICD-10-CM | POA: Diagnosis not present

## 2021-11-10 MED ORDER — METRONIDAZOLE 0.75 % EX GEL
CUTANEOUS | 0 refills | Status: AC
  Filled 2021-11-10: qty 45, 30d supply, fill #0

## 2021-11-10 NOTE — Progress Notes (Signed)
Subjective:     Patient ID: Tony Paul, male    DOB: 21-Sep-1961, 61 y.o.   MRN: [id number]  Chief Complaint  Patient presents with   Annual Exam    HPI  Patient presents today for complete physical.  Immunizations: Tdap due, flu shot due, Shingrix #2 due Diet: fair Wt Readings from Last 3 Encounters:  11/10/21 226 lb (102.5 kg)  03/17/21 217 lb (98.4 kg)  09/21/19 222 lb (100.7 kg)  Exercise:  walks, gym most days, has a total gym at home that he uses Colonoscopy: 09/17/16- due for follow up. Vision: up to date Dental:  up to date PSA: due     Lab Results  Component Value Date   PSA 1.26 07/17/2019   PSA 0.62 03/24/2018   PSA 0.65 11/22/2016     Health Maintenance Due  Topic Date Due   HIV Screening  Never done   COVID-19 Vaccine (4 - Booster) 12/23/2020   COLONOSCOPY (Pts 45-27yr Insurance coverage will need to be confirmed)  09/17/2021    Past Medical History:  Diagnosis Date   colon ca dx'd 08/2012   Depression with anxiety 01/04/2013   Headache 11/22/2016   Health maintenance examination    Heart murmur    Heel pain, chronic, left 11/22/2016   HLD (hyperlipidemia)    Low back pain 11/22/2016   Nocturia 04/09/2013   Restless sleeper 04/09/2013   Rosacea    Skin cancer    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    Past Surgical History:  Procedure Laterality Date   COLON SURGERY  2013   COLONOSCOPY     CYSTOSCOPY WITH INSERTION OF UROLIFT     LAPAROSCOPIC LOW ANTERIOR RESECTION  10/09/2012   Procedure: LAPAROSCOPIC LOW ANTERIOR RESECTION;  Surgeon: ALeighton Ruff MD;  Location: [facility];  Service: General;  Laterality: N/A;   TONSILLECTOMY  1971    Family History  Problem Relation Age of Onset   Urinary tract infection Mother        sepsis   Heart failure Father    Kidney disease Father    Hypertension Other    Hyperlipidemia Other    Kidney disease Other    Colon cancer Neg Hx    Prostate cancer Neg Hx    Esophageal cancer  Neg Hx    Rectal cancer Neg Hx    Stomach cancer Neg Hx     Social History   Socioeconomic History   Marital status: Married    Spouse name: Not on file   Number of children: Not on file   Years of education: Not on file   Highest education level: Not on file  Occupational History   Not on file  Tobacco Use   Smoking status: Never   Smokeless tobacco: Never  Substance and Sexual Activity   Alcohol use: Yes    Alcohol/week: 1.0 standard drink    Types: 1 Glasses of wine per week    Comment: some weeks none   Drug use: No   Sexual activity: Yes    Partners: Female  Other Topics Concern   Not on file  Social History Narrative   Works for [employer]  Married 30 years   3 sons 21, 25,29   Social Determinants of HRadio broadcast assistantStrain: Not on fComcastInsecurity: Not on file  Transportation Needs: Not on file  Physical Activity: Not on file  Stress: Not  on file  Social Connections: Not on file  Intimate Partner Violence: Not on file    Outpatient Medications Prior to Visit  Medication Sig Dispense Refill   Multiple Vitamin (MULTIVITAMIN) tablet Take 1 tablet by mouth daily.     aspirin EC 81 MG tablet Take 81 mg by mouth daily. On occasions (Patient not taking: Reported on 03/17/2021)     benzonatate (TESSALON PERLES) 100 MG capsule Take 1 capsule (100 mg total) by mouth 3 (three) times daily as needed for cough. 20 capsule 0   Diclofenac Sodium (PENNSAID) 2 % SOLN Place 1 application onto the skin 2 (two) times daily. (Patient not taking: Reported on 03/17/2021) 112 g 3   doxycycline (VIBRA-TABS) 100 MG tablet Take 1 tablet (100 mg total) by mouth 2 (two) times daily. 14 tablet 0   fluticasone (FLONASE) 50 MCG/ACT nasal spray Place 1 spray into both nostrils daily. 16 g 2   Glucosamine-Chondroitin-MSM (CONDROLITE) 500-200-150 MG TABS Take by mouth. (Patient not taking: Reported on 03/17/2021)     hydrOXYzine (ATARAX/VISTARIL) 10 MG  tablet 1-2 tab po q 8 hours as needed itching. (Patient not taking: Reported on 03/17/2021) 30 tablet 0   levocetirizine (XYZAL) 5 MG tablet TAKE 1 TABLET BY MOUTH EVERY EVENING (Patient not taking: Reported on 03/17/2021) 30 tablet 3   nitroGLYCERIN (NITRODUR - DOSED IN MG/24 HR) 0.2 mg/hr patch Cut and apply 1/4 patch to most painful area q24h. (Patient not taking: Reported on 03/17/2021) 30 patch 11   predniSONE (DELTASONE) 20 MG tablet Take 1 tablet (20 mg total) by mouth daily with breakfast. 5 tablet 0   tamsulosin (FLOMAX) 0.4 MG CAPS capsule TAKE 1 CAPSULE BY MOUTH ONCE DAILY (Patient not taking: Reported on 03/17/2021) 30 capsule 3   No facility-administered medications prior to visit.    Allergies  Allergen Reactions   Cat Hair Extract Other (See Comments)    Itchy eyes   Dog Epithelium Itching   Other Other (See Comments)    Itchy eyes    Review of Systems  Constitutional:  Negative for fever.  HENT:  Negative for congestion.   Respiratory:  Negative for cough.   Gastrointestinal:        Rare constipation or diarrhea  Genitourinary:  Positive for urgency (rare). Negative for frequency.  Musculoskeletal:  Positive for back pain (last week, still having some left upper back pain- worse with certain movements). Negative for joint pain and myalgias.  Skin:  Positive for rash (rosacea).  Neurological:  Negative for headaches.  Endo/Heme/Allergies:  Positive for environmental allergies.  Psychiatric/Behavioral: Negative.        Objective:    Physical Exam  BP 135/90    Pulse 71    Temp 98 F (36.7 C) (Oral)    Resp 16    Ht _0  (1.753 m)    Wt 226 lb (102.5 kg)    SpO2 96%    BMI 33.37 kg/m  Wt Readings from Last 3 Encounters:  11/10/21 226 lb (102.5 kg)  03/17/21 217 lb (98.4 kg)  09/21/19 222 lb (100.7 kg)   Physical Exam  Constitutional: He is oriented to person, place, and time. He appears well-developed and well-nourished. No distress.  HENT:  Head:  Normocephalic and atraumatic.  Right Ear: Tympanic membrane and ear canal normal.  Left Ear: Tympanic membrane and ear canal normal.  Mouth/Throat: Not examined- pt wearing mask Eyes: Pupils are equal, round, and reactive to light. No scleral icterus.  Neck: Normal  range of motion. No thyromegaly present.  Cardiovascular: Normal rate and regular rhythm.   No murmur heard. Pulmonary/Chest: Effort normal and breath sounds normal. No respiratory distress. He has no wheezes. He has no rales. He exhibits no tenderness.  Abdominal: Soft. Bowel sounds are normal. He exhibits no distension and no mass. There is no tenderness. There is no rebound and no guarding. No left CVAT noted Musculoskeletal: He exhibits no edema.  Lymphadenopathy:    He has no cervical adenopathy.  Neurological: He is alert and oriented to person, place, and time. He has normal patellar reflexes. He exhibits normal muscle tone. Coordination normal.  Skin: Skin is warm and dry.  Psychiatric: He has a normal mood and affect. His behavior is normal. Judgment and thought content normal.           Assessment & Plan:        Assessment & Plan:   Problem List Items Addressed This Visit       Unprioritized   ROSACEA    Stable, requests refill of metrogel to help with redness.       Relevant Medications   metroNIDAZOLE (METROGEL) 0.75 % gel   Hyperlipidemia    Not on statin, check follow up lipid panel.       Relevant Orders   Comp Met (CMET)   Lipid panel   Flank pain    New. Check UA to rule out microscopic hematuria. I suspect musculoskeletal pain. Recommended short course of prn ibuprofen. Call if symptoms worsen or fail to improve.       Relevant Orders   Urinalysis, Routine w reflex microscopic   Elevated blood pressure reading    Mild elevation of bp today. Recommended that he work on low sodium diet and return in 1 month for a nurse visit BP check.       Annual physical exam - Primary    Discussed  healthy diet, exercise, weight loss. Refer for colo.  Check PSA (discussed pro's/con's).  Shingrix #2, Flu shot and Tdap today. He plans to get Bivalent Covid booster at the pharmacy.       Other Visit Diagnoses     Prostate cancer screening       Relevant Orders   PSA   Colon cancer screening       Relevant Orders   Ambulatory referral to Gastroenterology   Need for Tdap vaccination       Relevant Orders   Tdap vaccine greater than or equal to 7yo IM (Completed)   Need for shingles vaccine       Relevant Orders   Varicella-zoster vaccine IM (Shingrix) (Completed)   Needs flu shot       Relevant Orders   Flu Vaccine QUAD 6+ mos PF IM (Fluarix Quad PF) (Completed)       I have discontinued Alassane Herberg's aspirin EC, fluticasone, hydrOXYzine, Condrolite, nitroGLYCERIN, Pennsaid, tamsulosin, levocetirizine, doxycycline, predniSONE, and benzonatate. I am also having him start on metroNIDAZOLE. Additionally, I am having him maintain his multivitamin.  Meds ordered this encounter  Medications   metroNIDAZOLE (METROGEL) 0.75 % gel    Sig: Apply on to the skin of the face once daily as needed    Dispense:  45 g    Refill:  0    Order Specific Question:   Supervising Provider    Answer:   Penni Homans A [[id number]]

## 2021-11-10 NOTE — Assessment & Plan Note (Signed)
New. Check UA to rule out microscopic hematuria. I suspect musculoskeletal pain. Recommended short course of prn ibuprofen. Call if symptoms worsen or fail to improve.

## 2021-11-10 NOTE — Patient Instructions (Addendum)
Please consider getting a Herbalist.  Complete your lab work prior to leaving. For your back pain you may use ibuprofen as needed for the next 1 week. Let us know if your symptoms worsen or if they do not improve.  Continue to work on [employer], exercise, weight loss.

## 2021-11-10 NOTE — Assessment & Plan Note (Signed)
Not on statin, check follow up lipid panel.

## 2021-11-10 NOTE — Assessment & Plan Note (Signed)
Stable, requests refill of metrogel to help with redness.

## 2021-11-10 NOTE — Assessment & Plan Note (Signed)
Mild elevation of bp today. Recommended that he work on low sodium diet and return in 1 month for a nurse visit BP check.

## 2021-11-10 NOTE — Assessment & Plan Note (Signed)
Discussed healthy diet, exercise, weight loss. Refer for colo.  Check PSA (discussed pro's/con's).  Shingrix #2, Flu shot and Tdap today. He plans to get Bivalent Covid booster at the pharmacy.

## 2021-11-11 LAB — COMPREHENSIVE METABOLIC PANEL
AG Ratio: 2.3 (calc) (ref 1.0–2.5)
ALT: 17 U/L (ref 9–46)
AST: 18 U/L (ref 10–35)
Albumin: 4.6 g/dL (ref 3.6–5.1)
Alkaline phosphatase (APISO): 65 U/L (ref 35–144)
BUN: 17 mg/dL (ref 7–25)
CO2: 29 mmol/L (ref 20–32)
Calcium: 9.7 mg/dL (ref 8.6–10.3)
Chloride: 104 mmol/L (ref 98–110)
Creat: 0.9 mg/dL (ref 0.70–1.35)
Globulin: 2 g/dL (calc) (ref 1.9–3.7)
Glucose, Bld: 85 mg/dL (ref 65–99)
Potassium: 4.4 mmol/L (ref 3.5–5.3)
Sodium: 140 mmol/L (ref 135–146)
Total Bilirubin: 0.7 mg/dL (ref 0.2–1.2)
Total Protein: 6.6 g/dL (ref 6.1–8.1)

## 2021-11-11 LAB — LIPID PANEL
Cholesterol: 250 mg/dL — ABNORMAL HIGH
HDL: 47 mg/dL
LDL Cholesterol (Calc): 168 mg/dL — ABNORMAL HIGH
Non-HDL Cholesterol (Calc): 203 mg/dL — ABNORMAL HIGH
Total CHOL/HDL Ratio: 5.3 (calc) — ABNORMAL HIGH
Triglycerides: 190 mg/dL — ABNORMAL HIGH

## 2021-11-11 LAB — PSA: PSA: 1.36 ng/mL

## 2021-11-11 LAB — URINALYSIS, ROUTINE W REFLEX MICROSCOPIC
Bilirubin Urine: NEGATIVE
Glucose, UA: NEGATIVE
Hgb urine dipstick: NEGATIVE
Ketones, ur: NEGATIVE
Leukocytes,Ua: NEGATIVE
Nitrite: NEGATIVE
Protein, ur: NEGATIVE
Specific Gravity, Urine: 1.018 (ref 1.001–1.035)
pH: 5.5 (ref 5.0–8.0)

## 2021-11-12 ENCOUNTER — Telehealth: Payer: Self-pay | Admitting: Family

## 2021-11-12 NOTE — Telephone Encounter (Signed)
See mychart.  

## 2021-12-04 ENCOUNTER — Encounter: Payer: Self-pay | Admitting: Internal Medicine

## 2021-12-15 ENCOUNTER — Ambulatory Visit (INDEPENDENT_AMBULATORY_CARE_PROVIDER_SITE_OTHER): Payer: BC Managed Care – PPO

## 2021-12-15 DIAGNOSIS — R030 Elevated blood-pressure reading, without diagnosis of hypertension: Secondary | ICD-10-CM

## 2021-12-15 NOTE — Progress Notes (Signed)
Pt here for Blood pressure check per Jeri Lager' [place]  "Return in about 4 weeks (around 12/08/2021) for nurse visit blood pressure check, "- 11/10/2021  Pt currently takes: no tx  BP today @ = 122/ 80 HR = 76  Pt advised per Melissa to continue to manage with lifestyle changes. Pt aware and voices understanding.

## 2022-01-18 ENCOUNTER — Encounter: Payer: Self-pay | Admitting: *Deleted

## 2022-01-25 ENCOUNTER — Telehealth: Payer: Self-pay | Admitting: *Deleted

## 2022-01-25 NOTE — Telephone Encounter (Signed)
Pt rescheduled PV for 4/10 ?

## 2022-01-25 NOTE — Telephone Encounter (Signed)
Unable to reach pt after multiple calls.  Message left to return call by 5pm otherwise colon would be cancelled. ?

## 2022-01-29 ENCOUNTER — Other Ambulatory Visit (HOSPITAL_BASED_OUTPATIENT_CLINIC_OR_DEPARTMENT_OTHER): Payer: Self-pay

## 2022-01-29 ENCOUNTER — Ambulatory Visit (AMBULATORY_SURGERY_CENTER): Payer: BC Managed Care – PPO | Admitting: *Deleted

## 2022-01-29 VITALS — Ht 69.0 in | Wt 220.0 lb

## 2022-01-29 DIAGNOSIS — Z85038 Personal history of other malignant neoplasm of large intestine: Secondary | ICD-10-CM

## 2022-01-29 MED ORDER — NA SULFATE-K SULFATE-MG SULF 17.5-3.13-1.6 GM/177ML PO SOLN
2.0000 | Freq: Once | ORAL | 0 refills | Status: AC
  Filled 2022-01-29: qty 354, 1d supply, fill #0

## 2022-01-29 NOTE — Progress Notes (Signed)

## 2022-02-01 ENCOUNTER — Encounter: Payer: Self-pay | Admitting: Internal Medicine

## 2022-02-09 ENCOUNTER — Encounter: Payer: BC Managed Care – PPO | Admitting: Internal Medicine

## 2022-02-12 ENCOUNTER — Ambulatory Visit (AMBULATORY_SURGERY_CENTER): Payer: BC Managed Care – PPO | Admitting: Internal Medicine

## 2022-02-12 ENCOUNTER — Encounter: Payer: Self-pay | Admitting: Internal Medicine

## 2022-02-12 VITALS — BP 135/78 | HR 67 | Temp 97.5°F | Resp 14 | Ht 69.0 in | Wt 220.0 lb

## 2022-02-12 DIAGNOSIS — D122 Benign neoplasm of ascending colon: Secondary | ICD-10-CM | POA: Diagnosis not present

## 2022-02-12 DIAGNOSIS — D123 Benign neoplasm of transverse colon: Secondary | ICD-10-CM

## 2022-02-12 DIAGNOSIS — Z1211 Encounter for screening for malignant neoplasm of colon: Secondary | ICD-10-CM | POA: Diagnosis not present

## 2022-02-12 DIAGNOSIS — Z85038 Personal history of other malignant neoplasm of large intestine: Secondary | ICD-10-CM

## 2022-02-12 MED ORDER — SODIUM CHLORIDE 0.9 % IV SOLN: 500.0000 mL | Freq: Once | INTRAVENOUS | Status: DC

## 2022-02-12 NOTE — Progress Notes (Signed)
Pt non-responsive, VVS, Report to RN  °

## 2022-02-12 NOTE — Op Note (Addendum)
[facility] ?Patient Name: Tony Paul ?Procedure Date: 02/12/2022 7:48 AM ?MRN: [id number] ?Endoscopist: Jerene Bears , MD ?Age: 61 ?Referring MD:  ?Date of Birth: 02/28/1961 ?Gender: Male ?Account #: [id number] ?Procedure:                Colonoscopy ?Indications:              High risk colon cancer surveillance: Personal  ?                          history of colon cancer (2013 treated with surgical  ?                          resection), Last colonoscopy: November 2017 ?Medicines:                Monitored Anesthesia Care ?Procedure:                Pre-Anesthesia Assessment: ?                          - Prior to the procedure, a History and Physical  ?                          was performed, and patient medications and  ?                          allergies were reviewed. The patient's tolerance of  ?                          previous anesthesia was also reviewed. The risks  ?                          and benefits of the procedure and the sedation  ?                          options and risks were discussed with the patient.  ?                          All questions were answered, and informed consent  ?                          was obtained. Prior Anticoagulants: The patient has  ?                          taken no previous anticoagulant or antiplatelet  ?                          agents. ASA Grade Assessment: II - A patient with  ?                          mild systemic disease. After reviewing the risks  ?                          and benefits, the patient was deemed in  ?  satisfactory condition to undergo the procedure. ?                          After obtaining informed consent, the colonoscope  ?                          was passed under direct vision. Throughout the  ?                          procedure, the patient's blood pressure, pulse, and  ?                          oxygen saturations were monitored continuously. The  ?                          Colonoscope was  introduced through the anus and  ?                          advanced to the cecum, identified by appendiceal  ?                          orifice and ileocecal valve. The colonoscopy was  ?                          performed without difficulty. The patient tolerated  ?                          the procedure well. The quality of the bowel  ?                          preparation was good. The ileocecal valve,  ?                          appendiceal orifice, and rectum were photographed. ?Scope In: 8:09:35 AM ?Scope Out: 8:27:01 AM ?Scope Withdrawal Time: 0 hours 14 minutes 48 seconds  ?Total Procedure Duration: 0 hours 17 minutes 26 seconds  ?Findings:                 The digital rectal exam was normal. ?                          Two sessile polyps were found in the ascending  ?                          colon. The polyps were 3 to 5 mm in size. These  ?                          polyps were removed with a cold snare. Resection  ?                          and retrieval were complete. ?                          A 3 mm polyp was found in the distal  ascending  ?                          colon. The polyp was sessile. The polyp was removed  ?                          with a cold biopsy forceps. Resection and retrieval  ?                          were complete. ?                          A 3 mm polyp was found in the transverse colon. The  ?                          polyp was sessile. The polyp was removed with a  ?                          cold biopsy forceps. Resection and retrieval were  ?                          complete. ?                          There was evidence of a prior end-to-end  ?                          colo-colonic anastomosis in the sigmoid colon. This  ?                          was patent and was characterized by healthy  ?                          appearing mucosa and visible sutures. ?                          Internal hemorrhoids were found during  ?                          retroflexion. The  hemorrhoids were small. ?Complications:            No immediate complications. ?Estimated Blood Loss:     Estimated blood loss was minimal. ?Impression:               - Two 3 to 5 mm polyps in the ascending colon,  ?                          removed with a cold snare. Resected and retrieved. ?                          - One 3 mm polyp in the distal ascending colon,  ?                          removed with a cold biopsy forceps. Resected and  ?  retrieved. ?                          - One 3 mm polyp in the transverse colon, removed  ?                          with a cold biopsy forceps. Resected and retrieved. ?                          - Patent end-to-end colo-colonic anastomosis,  ?                          characterized by healthy appearing mucosa and  ?                          visible sutures. ?                          - Internal hemorrhoids. ?Recommendation:           - Patient has a contact number available for  ?                          emergencies. The signs and symptoms of potential  ?                          delayed complications were discussed with the  ?                          patient. Return to normal activities tomorrow.  ?                          Written discharge instructions were provided to the  ?                          patient. ?                          - Resume previous diet. ?                          - Continue present medications. ?                          - Await pathology results. ?                          - Repeat colonoscopy is recommended for  ?                          surveillance. The colonoscopy date will be  ?                          determined after pathology results from today's  ?                          exam become available for review. ?Jerene Bears, MD ?02/12/2022  8:33:17 AM ?This report has been signed electronically. ?

## 2022-02-12 NOTE — Progress Notes (Signed)
Called to room to assist during endoscopic procedure.  Patient ID and intended procedure confirmed with present staff. Received instructions for my participation in the procedure from the performing physician.  

## 2022-02-12 NOTE — Patient Instructions (Addendum)
Resume previous medications.  4 polyps removed and sent to pathology.  Await results for final recommendations.   ?Handouts on findings given to patient (hemorrhoids and polyps) ? ?YOU HAD AN ENDOSCOPIC PROCEDURE TODAY AT [facility]:   Refer to the procedure report that was given to you for any specific questions about what was found during the examination.  If the procedure report does not answer your questions, please call your gastroenterologist to clarify.  If you requested that your care partner not be given the details of your procedure findings, then the procedure report has been included in a sealed envelope for you to review at your convenience later. ? ?YOU SHOULD EXPECT: Some feelings of bloating in the abdomen. Passage of more gas than usual.  Walking can help get rid of the air that was put into your GI tract during the procedure and reduce the bloating. If you had a lower endoscopy (such as a colonoscopy or flexible sigmoidoscopy) you may notice spotting of blood in your stool or on the toilet paper. If you underwent a bowel prep for your procedure, you may not have a normal bowel movement for a few days. ? ?Please Note:  You might notice some irritation and congestion in your nose or some drainage.  This is from the oxygen used during your procedure.  There is no need for concern and it should clear up in a day or so. ? ?SYMPTOMS TO REPORT IMMEDIATELY: ? ?Following lower endoscopy (colonoscopy or flexible sigmoidoscopy): ? Excessive amounts of blood in the stool ? Significant tenderness or worsening of abdominal pains ? Swelling of the abdomen that is new, acute ? Fever of 100?F or higher ? ? ?For urgent or emergent issues, a gastroenterologist can be reached at any hour by calling [phone]. ?Do not use MyChart messaging for urgent concerns.  ? ? ?DIET:  We do recommend a small meal at first, but then you may proceed to your regular diet.  Drink plenty of fluids but you should  avoid alcoholic beverages for 24 hours. ? ?ACTIVITY:  You should plan to take it easy for the rest of today and you should NOT DRIVE or use heavy machinery until tomorrow (because of the sedation medicines used during the test).   ? ?FOLLOW UP: ?Our staff will call the number listed on your records 48-72 hours following your procedure to check on you and address any questions or concerns that you may have regarding the information given to you following your procedure. If we do not reach you, we will leave a message.  We will attempt to reach you two times.  During this call, we will ask if you have developed any symptoms of COVID 19. If you develop any symptoms (ie: fever, flu-like symptoms, shortness of breath, cough etc.) before then, please call [phone].  If you test positive for Covid 19 in the 2 weeks post procedure, please call and report this information to [place].   ? ?If any biopsies were taken you will be contacted by phone or by letter within the next 1-3 weeks.  Please call us at [phone] if you have not heard about the biopsies in 3 weeks.  ? ? ?SIGNATURES/CONFIDENTIALITY: ?You and/or your care partner have signed paperwork which will be entered into your electronic medical record.  These signatures attest to the fact that that the information above on your After Visit Summary has been reviewed and is understood.  Full responsibility of the confidentiality  of this discharge information lies with you and/or your care-partner.  ?

## 2022-02-12 NOTE — Progress Notes (Signed)
? ?GASTROENTEROLOGY PROCEDURE H&P NOTE  ? ?Primary Care Physician: ?Mosie Lukes, MD ? ? ? ?Reason for Procedure:   Hx of colon cancer ? ?Plan:    colonoscopy ? ?Patient is appropriate for endoscopic procedure(s) in the ambulatory ([facility]) setting. ? ?The nature of the procedure, as well as the risks, benefits, and alternatives were carefully and thoroughly reviewed with the patient. Ample time for discussion and questions allowed. The patient understood, was satisfied, and agreed to proceed.  ? ? ? ?HPI: ?Tony Paul is a 61 y.o. male who presents for colonoscopy.  Medical history as below.  Tolerated the prep.  No recent chest pain or shortness of breath.  No abdominal pain today. ? ?Past Medical History:  ?Diagnosis Date  ? colon ca dx'd 08/2012  ? Depression with anxiety 01/04/2013  ? Headache 11/22/2016  ? Health maintenance examination   ? Heart murmur   ? Heel pain, chronic, left 11/22/2016  ? HLD (hyperlipidemia)   ? Low back pain 11/22/2016  ? Nocturia 04/09/2013  ? Restless sleeper 04/09/2013  ? Rosacea   ? Skin cancer   ? Tubulovillous adenoma polyp of colon   ? sigmoid colon  ? ? ?Past Surgical History:  ?Procedure Laterality Date  ? COLON SURGERY  2013  ? COLONOSCOPY    ? CYSTOSCOPY WITH INSERTION OF UROLIFT    ? LAPAROSCOPIC LOW ANTERIOR RESECTION  10/09/2012  ? Procedure: LAPAROSCOPIC LOW ANTERIOR RESECTION;  Surgeon: Leighton Ruff, MD;  Location: [facility];  Service: General;  Laterality: N/A;  ? TONSILLECTOMY  1971  ? ? ?Prior to Admission medications   ?Medication Sig Start Date End Date Taking? Authorizing Provider  ?alfuzosin (UROXATRAL) 10 MG 24 hr tablet Take 1 tablet by mouth daily. 12/28/16   [provider]  ?metroNIDAZOLE (METROGEL) 0.75 % gel Apply on to the skin of the face once daily as needed 11/10/21   Debbrah Alar, NP  ?Multiple Vitamin (MULTIVITAMIN) tablet Take 1 tablet by mouth daily.    [provider]  ?Omega-3 Fatty Acids (FISH OIL) 1000 MG CAPS Take by mouth.     [provider]  ?omeprazole (PRILOSEC) 40 MG capsule Take 1 capsule by mouth daily. ?Patient not taking: Reported on 01/29/2022 01/10/17   [provider]  ? ? ?Current Outpatient Medications  ?Medication Sig Dispense Refill  ? alfuzosin (UROXATRAL) 10 MG 24 hr tablet Take 1 tablet by mouth daily.    ? metroNIDAZOLE (METROGEL) 0.75 % gel Apply on to the skin of the face once daily as needed 45 g 0  ? Multiple Vitamin (MULTIVITAMIN) tablet Take 1 tablet by mouth daily.    ? Omega-3 Fatty Acids (FISH OIL) 1000 MG CAPS Take by mouth.    ? omeprazole (PRILOSEC) 40 MG capsule Take 1 capsule by mouth daily. (Patient not taking: Reported on 01/29/2022)    ? ?Current Facility-Administered Medications  ?Medication Dose Route Frequency Provider Last Rate Last Admin  ? 0.9 %  sodium chloride infusion  500 mL Intravenous Once Sanjuanita Condrey, Lajuan Lines, MD      ? ? ?Allergies as of 02/12/2022 - Review Complete 02/12/2022  ?Allergen Reaction Noted  ? Cat hair extract Other (See Comments) 03/31/2015  ? Dog epithelium Itching 07/17/2019  ? Other Other (See Comments) 03/31/2015  ? ? ?Family History  ?Problem Relation Age of Onset  ? Urinary tract infection Mother   ?     sepsis  ? Heart failure Father   ? Kidney disease Father   ?  Hypertension Other   ? Hyperlipidemia Other   ? Kidney disease Other   ? Colon cancer Neg Hx   ? Prostate cancer Neg Hx   ? Esophageal cancer Neg Hx   ? Rectal cancer Neg Hx   ? Stomach cancer Neg Hx   ? ? ?Social History  ? ?Socioeconomic History  ? Marital status: Married  ?  Spouse name: Not on file  ? Number of children: Not on file  ? Years of education: Not on file  ? Highest education level: Not on file  ?Occupational History  ? Not on file  ?Tobacco Use  ? Smoking status: Never  ? Smokeless tobacco: Never  ?Vaping Use  ? Vaping Use: Never used  ?Substance and Sexual Activity  ? Alcohol use: Yes  ?  Alcohol/week: 1.0 standard drink  ?  Types: 1 Glasses of wine per week  ?  Comment: some weeks  none  ? Drug use: No  ? Sexual activity: Yes  ?  Partners: Female  ?Other Topics Concern  ? Not on file  ?Social History Narrative  ? Works for [employer] and [employer]  ? Married 30 years  ? 3 sons 79, 25,29  ? ?Social Determinants of Health  ? ?Financial Resource Strain: Not on file  ?Food Insecurity: Not on file  ?Transportation Needs: Not on file  ?Physical Activity: Not on file  ?Stress: Not on file  ?Social Connections: Not on file  ?Intimate Partner Violence: Not on file  ? ? ?Physical Exam: ?Vital signs in last 24 hours: ?'@BP'$  (!) 155/94   Pulse 71   Temp (!) 97.5 ?F (36.4 ?C)   Ht '5\' 9"'$  (1.753 m)   Wt 220 lb (99.8 kg)   SpO2 97%   BMI 32.49 kg/m?  ?GEN: NAD ?EYE: Sclerae anicteric ?ENT: MMM ?CV: Non-tachycardic ?Pulm: CTA b/l ?GI: Soft, NT/ND ?NEURO:  Alert & Oriented x 3 ? ? ?[name], MD ?[facility] ? ?02/12/2022 8:07 AM ? ?

## 2022-02-12 NOTE — Progress Notes (Signed)
Pt's states no medical or surgical changes since previsit or office visit. 

## 2022-02-14 ENCOUNTER — Telehealth: Payer: Self-pay

## 2022-02-14 NOTE — Telephone Encounter (Signed)
?  Follow up Call- ? ? ?  02/12/2022  ?  7:15 AM  ?Call back number  ?Post procedure Call Back phone  # [phone]  ?Permission to leave phone message Yes  ?  ? ?Patient questions: ? ?Do you have a fever, pain , or abdominal swelling? No. ?Pain Score  0 * ? ?Have you tolerated food without any problems? Yes.   ? ?Have you been able to return to your normal activities? Yes.   ? ?Do you have any questions about your discharge instructions: ?Diet   No. ?Medications  No. ?Follow up visit  No. ? ?Do you have questions or concerns about your Care? No. ? ?Actions: ?* If pain score is 4 or above: ?No action needed, pain <4. ? ? ?

## 2022-02-21 ENCOUNTER — Encounter: Payer: Self-pay | Admitting: Internal Medicine

## 2022-08-20 ENCOUNTER — Other Ambulatory Visit (HOSPITAL_BASED_OUTPATIENT_CLINIC_OR_DEPARTMENT_OTHER): Payer: Self-pay

## 2022-08-20 DIAGNOSIS — E669 Obesity, unspecified: Secondary | ICD-10-CM | POA: Diagnosis not present

## 2022-08-20 DIAGNOSIS — L718 Other rosacea: Secondary | ICD-10-CM | POA: Diagnosis not present

## 2022-08-20 DIAGNOSIS — L2089 Other atopic dermatitis: Secondary | ICD-10-CM | POA: Diagnosis not present

## 2022-08-20 MED ORDER — TRIAMCINOLONE ACETONIDE 0.1 % EX CREA
1.0000 | TOPICAL_CREAM | Freq: Two times a day (BID) | CUTANEOUS | 1 refills | Status: AC
  Filled 2022-08-20: qty 75, 30d supply, fill #0

## 2022-11-19 ENCOUNTER — Ambulatory Visit (INDEPENDENT_AMBULATORY_CARE_PROVIDER_SITE_OTHER): Payer: BC Managed Care – PPO | Admitting: Medical

## 2022-11-19 ENCOUNTER — Other Ambulatory Visit (HOSPITAL_BASED_OUTPATIENT_CLINIC_OR_DEPARTMENT_OTHER): Payer: Self-pay

## 2022-11-19 VITALS — BP 107/74 | HR 80 | Temp 97.8°F | Resp 18 | Ht 69.0 in | Wt 234.0 lb

## 2022-11-19 DIAGNOSIS — R0981 Nasal congestion: Secondary | ICD-10-CM

## 2022-11-19 DIAGNOSIS — H60509 Unspecified acute noninfective otitis externa, unspecified ear: Secondary | ICD-10-CM | POA: Diagnosis not present

## 2022-11-19 DIAGNOSIS — J3489 Other specified disorders of nose and nasal sinuses: Secondary | ICD-10-CM | POA: Diagnosis not present

## 2022-11-19 DIAGNOSIS — H6690 Otitis media, unspecified, unspecified ear: Secondary | ICD-10-CM | POA: Diagnosis not present

## 2022-11-19 MED ORDER — BENZONATATE 100 MG PO CAPS
100.0000 mg | ORAL_CAPSULE | Freq: Three times a day (TID) | ORAL | 0 refills | Status: AC | PRN
  Filled 2022-11-19: qty 30, 10d supply, fill #0

## 2022-11-19 MED ORDER — FLUTICASONE PROPIONATE 50 MCG/ACT NA SUSP
2.0000 | Freq: Every day | NASAL | 1 refills | Status: AC
  Filled 2022-11-19: qty 16, 21d supply, fill #0

## 2022-11-19 MED ORDER — CEFTRIAXONE SODIUM 1 G IJ SOLR
1.0000 g | Freq: Once | INTRAMUSCULAR | Status: AC
  Administered 2022-11-19: 1 g via INTRAMUSCULAR

## 2022-11-19 MED ORDER — NEOMYCIN-POLYMYXIN-HC 3.5-10000-1 OT SOLN
3.0000 [drp] | Freq: Four times a day (QID) | OTIC | 0 refills | Status: AC
  Filled 2022-11-19: qty 10, 17d supply, fill #0

## 2022-11-19 MED ORDER — AMOXICILLIN-POT CLAVULANATE 875-125 MG PO TABS
1.0000 | ORAL_TABLET | Freq: Two times a day (BID) | ORAL | 0 refills | Status: AC
  Filled 2022-11-19: qty 20, 10d supply, fill #0

## 2022-11-19 NOTE — Progress Notes (Signed)
Subjective:    Patient ID: Tony Paul, male    DOB: [birth year], 62 y.o.   MRN: [id number]  HPI  Pt in for 5 days of nasal congestion, sinus pressure, ear pain and occasional cough. Pt states can feel post nasal drainage.   Slight eye itching.   He also reports ear was itching rt side and he was digging with cap of writing pen(thin portion). He saw clear drainage.    No hx of diabetes- no review I don't see elevated sugar on metabolic panel and I don't see elevated A1c.   Review of Systems  Constitutional:  Negative for chills, fatigue and fever.  HENT:  Positive for congestion and sinus pressure.   Respiratory:  Negative for cough, chest tightness, shortness of breath and wheezing.   Cardiovascular:  Negative for chest pain.  Gastrointestinal:  Negative for abdominal pain.  Genitourinary:  Negative for dysuria, flank pain, genital sores, hematuria, penile pain and urgency.  Musculoskeletal:  Negative for back pain, myalgias and neck pain.  Skin:  Negative for rash.  Neurological:  Negative for dizziness and light-headedness.  Psychiatric/Behavioral:  Negative for behavioral problems and decreased concentration.     Past Medical History:  Diagnosis Date   colon ca dx'd 08/2012   Depression with anxiety 01/04/2013   Headache 11/22/2016   Health maintenance examination    Heart murmur    Heel pain, chronic, left 11/22/2016   HLD (hyperlipidemia)    Low back pain 11/22/2016   Nocturia 04/09/2013   Restless sleeper 04/09/2013   Rosacea    Skin cancer    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     Social History   Socioeconomic History   Marital status: Married    Spouse name: Not on file   Number of children: Not on file   Years of education: Not on file   Highest education level: Not on file  Occupational History   Not on file  Tobacco Use   Smoking status: Never   Smokeless tobacco: Never  Vaping Use   Vaping Use: Never used  Substance and Sexual  Activity   Alcohol use: Yes    Alcohol/week: 1.0 standard drink of alcohol    Types: 1 Glasses of wine per week    Comment: some weeks none   Drug use: No   Sexual activity: Yes    Partners: Female  Other Topics Concern   Not on file  Social History Narrative   Works for [employer] and [employer]   Married 52 years   3 sons 21, 25,29   Social Determinants of Radio broadcast assistant Strain: Not on [employer] Insecurity: Not on file  Transportation Needs: Not on file  Physical Activity: Not on file  Stress: Not on file  Social Connections: Not on file  Intimate Partner Violence: Not on file    Past Surgical History:  Procedure Laterality Date   COLON SURGERY  2013   COLONOSCOPY     CYSTOSCOPY WITH INSERTION OF UROLIFT     LAPAROSCOPIC LOW ANTERIOR RESECTION  10/09/2012   Procedure: LAPAROSCOPIC LOW ANTERIOR RESECTION;  Surgeon: Leighton Ruff, MD;  Location: [facility];  Service: General;  Laterality: N/A;   TONSILLECTOMY  1971    Family History  Problem Relation Age of Onset   Urinary tract infection Mother        sepsis   Heart failure Father    Kidney disease Father  Hypertension Other    Hyperlipidemia Other    Kidney disease Other    Colon cancer Neg Hx    Prostate cancer Neg Hx    Esophageal cancer Neg Hx    Rectal cancer Neg Hx    Stomach cancer Neg Hx     Allergies  Allergen Reactions   Cat Hair Extract Other (See Comments)    Itchy eyes   Dog Epithelium Itching   Other Other (See Comments)    Itchy eyes    Current Outpatient Medications on File Prior to Visit  Medication Sig Dispense Refill   alfuzosin (UROXATRAL) 10 MG 24 hr tablet Take 1 tablet by mouth daily.     metroNIDAZOLE (METROGEL) 0.75 % gel Apply on to the skin of the face once daily as needed 45 g 0   Multiple Vitamin (MULTIVITAMIN) tablet Take 1 tablet by mouth daily.     Omega-3 Fatty Acids (FISH OIL) 1000 MG CAPS Take by mouth.     omeprazole (PRILOSEC) 40 MG capsule  Take 1 capsule by mouth daily.     triamcinolone cream (KENALOG) 0.1 % Apply 1 Application topically 2 (two) times daily for up to 3 weeks in a row 75 g 1   No current facility-administered medications on file prior to visit.    BP 107/74   Pulse 80   Temp 97.8 F (36.6 C)   Resp 18   Ht '5\' 9"'$  (1.753 m)   Wt 234 lb (106.1 kg)   SpO2 98%   BMI 34.56 kg/m        Objective:   Physical Exam  General Mental Status- Alert. General Appearance- Not in acute distress.   Skin General: Color- Normal Color. Moisture- Normal Moisture.  Neck Carotid Arteries- Normal color. Moisture- Normal Moisture. No carotid bruits. No JVD.  Chest and Lung Exam Auscultation: Breath Sounds:-Normal.  Cardiovascular Auscultation:Rythm- Regular. Murmurs & Other Heart Sounds:Auscultation of the heart reveals- No Murmurs.   Neurologic Cranial Nerve exam:- CN III-XII intact(No nystagmus), symmetric smile. Strength:- 5/5 equal and symmetric strength both upper and lower extremities.   Heent- nasal congestion but no frontal or maxillary sinus pressure to palpation.  Left canal is clear and TM is normal.  Right canal is very swollen with wax obstructing view of tympanic membrane.  No posterior regular tenderness to palpation on the right side.  He does have tragal tenderness and entrance of the ear canal is moderately swollen as well.  Minimal lymph node swollen just underneath ear.  Neck no nuchal rigidity.    Assessment & Plan:   Patient Instructions  Recent nasal congestion/uri type symptoms with  sinus pressure initially but now rt ear discomfort and swelling. On exam moderate to severe otitis externa and concern for ear infection/otitis media(not able to see TM)  Rocephin 1 gram IM.  Rx augmentin 875 mg twice daily, cortisporin otic drops and flonase nasal spray.  For cough making benzonatate available.  I had wanted you to follow up on Wednesday at 9:40 am. I do think this would be best. I  don't have avaiability on Friday presently. Would ask you call me and give up date by tomorrow after noon on how you are doing.    Mackie Pai, PA-C   Considering may benefit from additional injeciton and want to re-evaluate before he travels out of town.

## 2022-11-19 NOTE — Patient Instructions (Addendum)
Recent nasal congestion/uri type symptoms with  sinus pressure initially but now rt ear discomfort and swelling. On exam moderate to severe otitis externa and concern for ear infection/otitis media(not able to see TM)  Rocephin 1 gram IM.  Rx augmentin 875 mg twice daily, cortisporin otic drops and flonase nasal spray.  For cough making benzonatate available.  I had wanted you to follow up on Wednesday at 9:40 am. I do think this would be best. I don't have availability on Friday presently. Would ask you call me and give up date by tomorrow after noon on how you are doing.

## 2022-11-19 NOTE — Addendum Note (Signed)
Addended by: Jeronimo Greaves on: 11/19/2022 01:17 PM   Modules accepted: Orders

## 2022-11-21 ENCOUNTER — Ambulatory Visit: Payer: BC Managed Care – PPO | Admitting: Medical

## 2023-02-04 ENCOUNTER — Encounter: Payer: Self-pay | Admitting: *Deleted

## 2024-04-13 ENCOUNTER — Other Ambulatory Visit (HOSPITAL_BASED_OUTPATIENT_CLINIC_OR_DEPARTMENT_OTHER): Payer: Self-pay

## 2024-04-13 DIAGNOSIS — D225 Melanocytic nevi of trunk: Secondary | ICD-10-CM | POA: Diagnosis not present

## 2024-04-13 DIAGNOSIS — L718 Other rosacea: Secondary | ICD-10-CM | POA: Diagnosis not present

## 2024-04-13 DIAGNOSIS — L218 Other seborrheic dermatitis: Secondary | ICD-10-CM | POA: Diagnosis not present

## 2024-04-13 DIAGNOSIS — L720 Epidermal cyst: Secondary | ICD-10-CM | POA: Diagnosis not present

## 2024-04-13 MED ORDER — DESONIDE 0.05 % EX CREA
1.0000 | TOPICAL_CREAM | Freq: Two times a day (BID) | CUTANEOUS | 3 refills | Status: AC | PRN
  Filled 2024-04-13: qty 60, 30d supply, fill #0

## 2024-04-28 ENCOUNTER — Other Ambulatory Visit (HOSPITAL_BASED_OUTPATIENT_CLINIC_OR_DEPARTMENT_OTHER): Payer: Self-pay

## 2024-12-04 ENCOUNTER — Encounter: Admitting: Physician Assistant

## 2024-12-28 ENCOUNTER — Encounter: Admitting: Family Medicine

## 2025-07-12 ENCOUNTER — Encounter: Payer: Self-pay | Admitting: Family Medicine
